# Patient Record
Sex: Female | Born: 1950
Health system: Southern US, Community
[De-identification: ages and names within clinical notes are randomized; demographics above are authoritative.]

## PROBLEM LIST (undated history)

## (undated) DIAGNOSIS — T7840XA Allergy, unspecified, initial encounter: Secondary | ICD-10-CM

## (undated) DIAGNOSIS — H269 Unspecified cataract: Secondary | ICD-10-CM

## (undated) DIAGNOSIS — H409 Unspecified glaucoma: Secondary | ICD-10-CM

## (undated) DIAGNOSIS — Z789 Other specified health status: Secondary | ICD-10-CM

## (undated) DIAGNOSIS — M8580 Other specified disorders of bone density and structure, unspecified site: Secondary | ICD-10-CM

## (undated) HISTORY — DX: Unspecified glaucoma: H40.9

## (undated) HISTORY — DX: Allergy, unspecified, initial encounter: T78.40XA

## (undated) HISTORY — DX: Other specified disorders of bone density and structure, unspecified site: M85.80

## (undated) HISTORY — DX: Unspecified cataract: H26.9

## (undated) HISTORY — DX: Other specified health status: Z78.9

## (undated) HISTORY — PX: COLONOSCOPY: SHX174

## (undated) HISTORY — PX: POLYPECTOMY: SHX149

## (undated) HISTORY — PX: BREAST CYST EXCISION: SHX579

---

## 1996-02-29 HISTORY — PX: TUBAL LIGATION: SHX77

## 2004-02-16 ENCOUNTER — Encounter: Admission: RE | Admit: 2004-02-16 | Discharge: 2004-02-16 | Payer: Self-pay | Admitting: Obstetrics and Gynecology

## 2004-02-29 HISTORY — PX: ABDOMINAL HYSTERECTOMY: SHX81

## 2004-05-05 ENCOUNTER — Observation Stay (HOSPITAL_COMMUNITY): Admission: RE | Admit: 2004-05-05 | Discharge: 2004-05-06 | Payer: Self-pay | Admitting: *Deleted

## 2013-04-20 ENCOUNTER — Encounter (HOSPITAL_COMMUNITY): Payer: Self-pay | Admitting: Emergency Medicine

## 2013-04-20 ENCOUNTER — Emergency Department (HOSPITAL_COMMUNITY)
Admission: EM | Admit: 2013-04-20 | Discharge: 2013-04-20 | Disposition: A | Discharge status: Home / Self Care | Payer: BC Managed Care – PPO | Attending: Emergency Medicine | Admitting: Emergency Medicine

## 2013-04-20 ENCOUNTER — Emergency Department (HOSPITAL_COMMUNITY): Payer: BC Managed Care – PPO

## 2013-04-20 DIAGNOSIS — M7989 Other specified soft tissue disorders: Secondary | ICD-10-CM

## 2013-04-20 DIAGNOSIS — M660 Rupture of popliteal cyst: Secondary | ICD-10-CM

## 2013-04-20 DIAGNOSIS — M79609 Pain in unspecified limb: Secondary | ICD-10-CM

## 2013-04-20 DIAGNOSIS — M7120 Synovial cyst of popliteal space [Baker], unspecified knee: Secondary | ICD-10-CM | POA: Insufficient documentation

## 2013-04-20 MED ORDER — IBUPROFEN 600 MG PO TABS: 600.0000 mg | ORAL_TABLET | Freq: Four times a day (QID) | ORAL | Status: DC | PRN

## 2013-04-20 MED ORDER — IBUPROFEN 800 MG PO TABS
800.0000 mg | ORAL_TABLET | Freq: Once | ORAL | Status: AC
  Administered 2013-04-20: 800 mg via ORAL
  Filled 2013-04-20: qty 1

## 2013-04-20 NOTE — ED Notes (Signed)
Per pt, states left knee/leg pain for about a week-pain worse, went to urgent care and was told to come here for possible clot

## 2013-04-20 NOTE — Discharge Instructions (Signed)
Elevate your leg. Try ice and heat for comfort. Take the ibuprofen for pain. Wear the knee sleeve for comfort. You can be rechecked by Dr Ronnie Derby, the orthopedist on call if you continue to have pain.

## 2013-04-20 NOTE — ED Provider Notes (Signed)
CSN: [id number]     Arrival date & time 04/20/13  1052 History   First MD Initiated Contact with Patient 04/20/13 1118     Chief Complaint  Patient presents with  . left leg pain      (Consider location/radiation/quality/duration/timing/severity/associated sxs/prior Treatment) HPI Pt reports she has had pain in her left calf radiating into her left posterior thigh for the past week. She reports the pain is getting worse. She denies any change in activity to precipitate the pain, no injury, no prolonged sitting or travel. She states she's never had this before. She denies chest pain, shortness of breath, fever, or having this discomfort in the past. . She denies any known family history for DVT, PE, or thrombophlebitis. She was seen today in urgent care and sent to the ED for further evaluation.   PCP none  History reviewed. No pertinent past medical history. Past Surgical History  Procedure Laterality Date  . Abdominal hysterectomy     No family history on file. History  Substance Use Topics  . Smoking status: Never Smoker   . Smokeless tobacco: Not on file  . Alcohol Use: No   retired  OB History   Grav Para Term Preterm Abortions TAB SAB Ect Mult Living                 Review of Systems  All other systems reviewed and are negative.      Allergies  Review of patient's allergies indicates no known allergies.  Home Medications   Current Outpatient Rx  Name  Route  Sig  Dispense  Refill  . ibuprofen (ADVIL,MOTRIN) 200 MG tablet   Oral   Take 200 mg by mouth every 6 (six) hours as needed for mild pain.         Marland Kitchen ibuprofen (ADVIL,MOTRIN) 600 MG tablet   Oral   Take 1 tablet (600 mg total) by mouth every 6 (six) hours as needed.   60 tablet   0    BP 130/69  Pulse 63  Temp(Src) 98.1 F (36.7 C) (Oral)  Resp 18  SpO2 97%  Vital signs normal   Physical Exam  Nursing note and vitals reviewed. Constitutional: She is oriented to person, place, and time.  She appears well-developed and well-nourished.  Non-toxic appearance. She does not appear ill. No distress.  HENT:  Head: Normocephalic and atraumatic.  Right Ear: External ear normal.  Left Ear: External ear normal.  Nose: Nose normal. No mucosal edema or rhinorrhea.  Mouth/Throat: Mucous membranes are normal. No dental abscesses or uvula swelling.  Eyes: Conjunctivae and EOM are normal. Pupils are equal, round, and reactive to light.  Neck: Normal range of motion and full passive range of motion without pain. Neck supple.  Pulmonary/Chest: Effort normal. No respiratory distress. She has no rhonchi. She exhibits no crepitus.  Abdominal: Normal appearance.  Musculoskeletal: Normal range of motion. She exhibits tenderness. She exhibits no edema.       Legs: Moves all extremities well.  Patient has no joint effusion on the right knee. Her right knee is nontender. Her right calf is nontender as is her right medial thigh. On the left she has a possible small effusion of her left knee. She's tender in her proximal calf on the left and into the popliteal area and the distal left thigh. She has mild fullness in the popliteal space, questionable Baker's cyst. She is nontender to palpation along the medial left thigh. Her skin is normal without  erythema or abrasions or lesions. Her anterior left knee is nontender.  Neurological: She is alert and oriented to person, place, and time. She has normal strength. No cranial nerve deficit.  Skin: Skin is warm, dry and intact. No rash noted. No erythema. No pallor.  Psychiatric: She has a normal mood and affect. Her speech is normal and behavior is normal. Her mood appears not anxious.    ED Course  Procedures (including critical care time)  12:38 Vascular Lab tech states her doppler US shows a ruptured Bakers cyst.   Pt given her test results. She was placed in a knee sleeve for comfort.   Labs Review Labs Reviewed - No data to display Imaging  Review Dg Knee Complete 4 Views Left  04/20/2013   CLINICAL DATA:  Knee pain.  No known injury.  EXAM: LEFT KNEE - COMPLETE 4+ VIEW  COMPARISON:  None.  FINDINGS: There is no evidence of fracture, dislocation, or joint effusion.  Mild degenerative spurring is seen involving the medial compartment and tibial spines, however there is no evidence of joint space narrowing. No other significant bone abnormality identified. Soft tissues are unremarkable.  IMPRESSION: No acute findings.  Early degenerative spurring of the medial compartment and tibial spines.   Electronically Signed   By: Earle Gell M.D.   On: 04/20/2013 12:31    EKG Interpretation   None       MDM   Final diagnoses:  Baker's cyst, ruptured    New Prescriptions   IBUPROFEN (ADVIL,MOTRIN) 600 MG TABLET    Take 1 tablet (600 mg total) by mouth every 6 (six) hours as needed.    Plan discharge   Rolland Porter, MD, Alanson Aly, MD 04/20/13 [phone]

## 2013-04-20 NOTE — Progress Notes (Addendum)
VASCULAR LAB PRELIMINARY  PRELIMINARY  PRELIMINARY  PRELIMINARY  Left lower extremity venous duplex completed.    Preliminary report:  Left:  No evidence of DVT or superficial thrombosis. There is a fluid filled space coursing approximately 7 cm from the popliteal fossa to the proximal calf consistent with a ruptured Baker's cyst.  Theresa Villarreal, RVS 04/20/2013, 12:35 PM

## 2013-08-09 DIAGNOSIS — M25562 Pain in left knee: Secondary | ICD-10-CM | POA: Insufficient documentation

## 2013-08-09 DIAGNOSIS — M1712 Unilateral primary osteoarthritis, left knee: Secondary | ICD-10-CM | POA: Insufficient documentation

## 2013-08-22 ENCOUNTER — Other Ambulatory Visit: Payer: Self-pay | Admitting: Orthopedic Surgery

## 2013-08-22 DIAGNOSIS — R609 Edema, unspecified: Secondary | ICD-10-CM

## 2013-08-22 DIAGNOSIS — M25562 Pain in left knee: Secondary | ICD-10-CM

## 2013-08-27 ENCOUNTER — Ambulatory Visit
Admission: RE | Admit: 2013-08-27 | Discharge: 2013-08-27 | Disposition: A | Discharge status: Home / Self Care | Payer: BC Managed Care – PPO | Source: Ambulatory Visit | Attending: Orthopedic Surgery | Admitting: Orthopedic Surgery

## 2013-08-27 DIAGNOSIS — R609 Edema, unspecified: Secondary | ICD-10-CM

## 2013-08-27 DIAGNOSIS — M25562 Pain in left knee: Secondary | ICD-10-CM

## 2014-05-23 ENCOUNTER — Encounter: Payer: Self-pay | Admitting: Family Medicine

## 2014-05-23 ENCOUNTER — Ambulatory Visit (INDEPENDENT_AMBULATORY_CARE_PROVIDER_SITE_OTHER): Payer: 59 | Admitting: Family Medicine

## 2014-05-23 VITALS — BP 131/77 | HR 69 | Temp 97.9°F | Resp 16 | Ht 68.5 in | Wt 240.0 lb

## 2014-05-23 DIAGNOSIS — E559 Vitamin D deficiency, unspecified: Secondary | ICD-10-CM

## 2014-05-23 DIAGNOSIS — Z23 Encounter for immunization: Secondary | ICD-10-CM

## 2014-05-23 DIAGNOSIS — R5383 Other fatigue: Secondary | ICD-10-CM

## 2014-05-23 LAB — COMPLETE METABOLIC PANEL WITH GFR
ALBUMIN: 4.1 g/dL (ref 3.5–5.2)
ALT: 13 U/L (ref 0–35)
AST: 16 U/L (ref 0–37)
Alkaline Phosphatase: 70 U/L (ref 39–117)
BUN: 14 mg/dL (ref 6–23)
CHLORIDE: 105 meq/L (ref 96–112)
CO2: 24 meq/L (ref 19–32)
Calcium: 9.4 mg/dL (ref 8.4–10.5)
Creat: 0.98 mg/dL (ref 0.50–1.10)
GFR, EST AFRICAN AMERICAN: 71 mL/min
GFR, EST NON AFRICAN AMERICAN: 62 mL/min
GLUCOSE: 75 mg/dL (ref 70–99)
POTASSIUM: 4.2 meq/L (ref 3.5–5.3)
SODIUM: 142 meq/L (ref 135–145)
TOTAL PROTEIN: 7.4 g/dL (ref 6.0–8.3)
Total Bilirubin: 0.5 mg/dL (ref 0.2–1.2)

## 2014-05-23 LAB — CBC WITH DIFFERENTIAL/PLATELET
BASOS ABS: 0 10*3/uL (ref 0.0–0.1)
Basophils Relative: 0 % (ref 0–1)
EOS PCT: 1 % (ref 0–5)
Eosinophils Absolute: 0.1 10*3/uL (ref 0.0–0.7)
HCT: 38.4 % (ref 36.0–46.0)
Hemoglobin: 12.6 g/dL (ref 12.0–15.0)
LYMPHS ABS: 2.4 10*3/uL (ref 0.7–4.0)
LYMPHS PCT: 39 % (ref 12–46)
MCH: 28.3 pg (ref 26.0–34.0)
MCHC: 32.8 g/dL (ref 30.0–36.0)
MCV: 86.3 fL (ref 78.0–100.0)
MONO ABS: 0.5 10*3/uL (ref 0.1–1.0)
MPV: 10.2 fL (ref 8.6–12.4)
Monocytes Relative: 8 % (ref 3–12)
Neutro Abs: 3.2 10*3/uL (ref 1.7–7.7)
Neutrophils Relative %: 52 % (ref 43–77)
PLATELETS: 268 10*3/uL (ref 150–400)
RBC: 4.45 MIL/uL (ref 3.87–5.11)
RDW: 14.2 % (ref 11.5–15.5)
WBC: 6.2 10*3/uL (ref 4.0–10.5)

## 2014-05-23 LAB — VITAMIN B12: Vitamin B-12: 753 pg/mL (ref 211–911)

## 2014-05-23 LAB — TSH: TSH: 1.512 u[IU]/mL (ref 0.350–4.500)

## 2014-05-23 NOTE — Progress Notes (Signed)
Subjective:    Patient ID: Theresa Villarreal, female    DOB: 06/05/1950, 64 y.o.   MRN: [id number] This chart was scribed for [name], MD by Zola Button, Medical Scribe. This patient was seen in Room 25 and the patient's care was started at 4:30 PM.   Chief Complaint  Patient presents with  . establish care    HPI HPI Comments: Theresa Villarreal is a 64 y.o. female who presents to the [facility] to establish care.  Her husband is a pt of mine and Charda always accompanies him to every visit so know well.  Health Maintenance: Patient has not had a breast exam recently. She had a hysterectomy in 2006 done in [place]. She denies chest pain, SOB, leg swelling, vaginal discharge and urinary symptoms.  Immunizations: Patient is not UTD on tetanus.  Sleep: Patient gets about 6-7 hours of sleep per night, going to bed around 9:00 PM. She will usually have nocturia once a night.  Vitamins/Supplements: She does not take any vitamins. She does not drink milk regularly.  Psychiatric: Patient states her mood has been good. She does get moody every once in a while.  Her husband has been trying to get a Hoveround wheelchair. He is on pain medications, taking 2 every 12 hours.  No past medical history on file. No current outpatient prescriptions on file prior to visit.   No current facility-administered medications on file prior to visit.   No Known Allergies   Review of Systems  Constitutional: Positive for fatigue. Negative for fever, chills, activity change, appetite change and unexpected weight change.  Respiratory: Negative for shortness of breath.   Cardiovascular: Negative for chest pain and leg swelling.  Gastrointestinal: Negative for vomiting.  Endocrine: Positive for polyuria.  Genitourinary: Negative.  Negative for vaginal discharge and enuresis.  Neurological: Negative for syncope and weakness.  Psychiatric/Behavioral: Negative for sleep disturbance,  dysphoric mood and agitation. The patient is not nervous/anxious.        Objective:  BP 131/77 mmHg  Pulse 69  Temp(Src) 97.9 F (36.6 C) (Oral)  Resp 16  Ht 5' 8.5" (1.74 m)  Wt 240 lb (108.863 kg)  BMI 35.96 kg/m2  SpO2 100%  Physical Exam  Constitutional: She is oriented to person, place, and time. She appears well-developed and well-nourished. No distress.  HENT:  Head: Normocephalic and atraumatic.  Mouth/Throat: Oropharynx is clear and moist. No oropharyngeal exudate.  Eyes: Pupils are equal, round, and reactive to light.  Neck: Neck supple. No thyromegaly present.  Thyroid normal.  Cardiovascular: Normal rate, regular rhythm, S1 normal, S2 normal and normal heart sounds.   No murmur heard. Pulmonary/Chest: Effort normal and breath sounds normal. No respiratory distress. She has no wheezes. She has no rales.  CTAB.  Musculoskeletal: She exhibits no edema.  Neurological: She is alert and oriented to person, place, and time. No cranial nerve deficit.  Skin: Skin is warm and dry. No rash noted.  Psychiatric: She has a normal mood and affect. Her behavior is normal.  Vitals reviewed.         Assessment & Plan:  Other fatigue - Plan: Vitamin B12, CBC with Differential/Platelet, COMPLETE METABOLIC PANEL WITH GFR, TSH, Vit D  25 hydroxy (rtn osteoporosis monitoring)  Need for prophylactic vaccination with combined diphtheria-tetanus-pertussis (DTP) vaccine - Plan: Tdap vaccine greater than or equal to 7yo IM   I personally performed the services described in this documentation, which was scribed in my  presence. The recorded information has been reviewed and considered, and addended by me as needed.  [name], MD MPH   Results for orders placed or performed in visit on 05/23/14  Vitamin B12  Result Value Ref Range   Vitamin B-12 753 211 - 911 pg/mL  CBC with Differential/Platelet  Result Value Ref Range   WBC 6.2 4.0 - 10.5 K/uL   RBC 4.45 3.87 - 5.11 MIL/uL    Hemoglobin 12.6 12.0 - 15.0 g/dL   HCT 38.4 36.0 - 46.0 %   MCV 86.3 78.0 - 100.0 fL   MCH 28.3 26.0 - 34.0 pg   MCHC 32.8 30.0 - 36.0 g/dL   RDW 14.2 11.5 - 15.5 %   Platelets 268 150 - 400 K/uL   MPV 10.2 8.6 - 12.4 fL   Neutrophils Relative % 52 43 - 77 %   Neutro Abs 3.2 1.7 - 7.7 K/uL   Lymphocytes Relative 39 12 - 46 %   Lymphs Abs 2.4 0.7 - 4.0 K/uL   Monocytes Relative 8 3 - 12 %   Monocytes Absolute 0.5 0.1 - 1.0 K/uL   Eosinophils Relative 1 0 - 5 %   Eosinophils Absolute 0.1 0.0 - 0.7 K/uL   Basophils Relative 0 0 - 1 %   Basophils Absolute 0.0 0.0 - 0.1 K/uL   Smear Review Criteria for review not met   COMPLETE METABOLIC PANEL WITH GFR  Result Value Ref Range   Sodium 142 135 - 145 mEq/L   Potassium 4.2 3.5 - 5.3 mEq/L   Chloride 105 96 - 112 mEq/L   CO2 24 19 - 32 mEq/L   Glucose, Bld 75 70 - 99 mg/dL   BUN 14 6 - 23 mg/dL   Creat 0.98 0.50 - 1.10 mg/dL   Total Bilirubin 0.5 0.2 - 1.2 mg/dL   Alkaline Phosphatase 70 39 - 117 U/L   AST 16 0 - 37 U/L   ALT 13 0 - 35 U/L   Total Protein 7.4 6.0 - 8.3 g/dL   Albumin 4.1 3.5 - 5.2 g/dL   Calcium 9.4 8.4 - 10.5 mg/dL   GFR, Est African American 71 mL/min   GFR, Est Non African American 62 mL/min  TSH  Result Value Ref Range   TSH 1.512 0.350 - 4.500 uIU/mL  Vit D  25 hydroxy (rtn osteoporosis monitoring)  Result Value Ref Range   Vit D, 25-Hydroxy 11 (L) 30 - 100 ng/mL

## 2014-05-24 LAB — VITAMIN D 25 HYDROXY (VIT D DEFICIENCY, FRACTURES): Vit D, 25-Hydroxy: 11 ng/mL — ABNORMAL LOW (ref 30–100)

## 2014-06-23 ENCOUNTER — Encounter: Payer: Self-pay | Admitting: Family Medicine

## 2014-06-23 DIAGNOSIS — E559 Vitamin D deficiency, unspecified: Secondary | ICD-10-CM | POA: Insufficient documentation

## 2014-06-23 DIAGNOSIS — R5383 Other fatigue: Secondary | ICD-10-CM | POA: Insufficient documentation

## 2014-06-23 MED ORDER — ERGOCALCIFEROL 1.25 MG (50000 UT) PO CAPS: 50000.0000 [IU] | ORAL_CAPSULE | ORAL | Status: DC

## 2014-06-23 NOTE — Addendum Note (Signed)
Addended by: [name] on: 06/23/2014 12:37 AM   Modules accepted: Orders

## 2014-07-31 ENCOUNTER — Encounter: Payer: Self-pay | Admitting: *Deleted

## 2014-12-01 ENCOUNTER — Telehealth: Payer: Self-pay | Admitting: Family Medicine

## 2014-12-01 DIAGNOSIS — Z1231 Encounter for screening mammogram for malignant neoplasm of breast: Secondary | ICD-10-CM

## 2014-12-01 NOTE — Telephone Encounter (Signed)
Spoke with patient about her overdue mammogram.   She is going to call [facility] and set it up at her earliest convenience.  She will have them fax [place] a copy of the report.

## 2014-12-11 LAB — HM MAMMOGRAPHY

## 2014-12-19 ENCOUNTER — Encounter: Payer: Self-pay | Admitting: Family Medicine

## 2014-12-19 LAB — HM MAMMOGRAPHY

## 2014-12-21 ENCOUNTER — Other Ambulatory Visit: Payer: Self-pay | Admitting: Family Medicine

## 2014-12-21 MED ORDER — ZOSTER VACCINE LIVE 19400 UNT/0.65ML ~~LOC~~ SOLR: 0.6500 mL | Freq: Once | SUBCUTANEOUS | Status: DC

## 2014-12-23 ENCOUNTER — Encounter: Payer: Self-pay | Admitting: Family Medicine

## 2016-01-22 ENCOUNTER — Ambulatory Visit (INDEPENDENT_AMBULATORY_CARE_PROVIDER_SITE_OTHER): Payer: Commercial Managed Care - HMO | Admitting: Family Medicine

## 2016-01-22 VITALS — BP 136/76 | HR 70 | Temp 98.2°F | Resp 16 | Ht 68.5 in | Wt 239.2 lb

## 2016-01-22 DIAGNOSIS — R1030 Lower abdominal pain, unspecified: Secondary | ICD-10-CM | POA: Diagnosis not present

## 2016-01-22 DIAGNOSIS — M545 Low back pain, unspecified: Secondary | ICD-10-CM

## 2016-01-22 LAB — POCT URINALYSIS DIP (MANUAL ENTRY)
Bilirubin, UA: NEGATIVE
Glucose, UA: NEGATIVE
Ketones, POC UA: NEGATIVE
Leukocytes, UA: NEGATIVE
Nitrite, UA: NEGATIVE
Protein Ur, POC: NEGATIVE
Spec Grav, UA: 1.02
Urobilinogen, UA: 0.2
pH, UA: 5.5

## 2016-01-22 LAB — POC MICROSCOPIC URINALYSIS (UMFC): Mucus: ABSENT

## 2016-01-22 MED ORDER — CYCLOBENZAPRINE HCL 10 MG PO TABS: 10.0000 mg | ORAL_TABLET | Freq: Three times a day (TID) | ORAL | 0 refills | Status: DC | PRN

## 2016-01-22 MED ORDER — TRAMADOL HCL 50 MG PO TABS: 50.0000 mg | ORAL_TABLET | Freq: Three times a day (TID) | ORAL | 1 refills | Status: DC | PRN

## 2016-01-22 MED ORDER — DICLOFENAC SODIUM 75 MG PO TBEC: 75.0000 mg | DELAYED_RELEASE_TABLET | Freq: Two times a day (BID) | ORAL | 0 refills | Status: DC

## 2016-01-22 NOTE — Progress Notes (Addendum)
By signing my name below, I, [name], attest that this documentation has been prepared under the direction and in the presence of [name], MD.  Electronically Signed: [name], Medical Scribe. 01/22/16. 8:23 AM.  Subjective:    Patient ID: Theresa Villarreal, female    DOB: 03/02/1950, 65 y.o.   MRN: [id number]  HPI Chief Complaint  Patient presents with  . Back Pain    x 1 week, took Ibuprofen, but nothing in the last 24 hours    HPI Comments: Theresa Villarreal is a 65 y.o. female who presents to the [facility] complaining of intermittent back pain onset 1 week. She noted the pain radiates to her lower abdomen, last for a couple of minutes and worsens with activities such as bending and lifting so she has to be careful. Pt mentions her lateral left thigh occasionally becomes numb. She's tried ibuprofen without relief of her symptoms and hasn't taken any today. Denies pain radiating down her legs, trouble urinating, change in eating/drinking, sleep disturbance, weakness, and rashes.   Patient Active Problem List   Diagnosis Date Noted  . Vitamin D deficiency 06/23/2014  . Caregiver with fatigue 06/23/2014   History reviewed. No pertinent past medical history. Past Surgical History:  Procedure Laterality Date  . ABDOMINAL HYSTERECTOMY     No Known Allergies Prior to Admission medications   Medication Sig Start Date End Date Taking? Authorizing Provider  ergocalciferol (VITAMIN D2) 50000 UNITS capsule Take 1 capsule (50,000 Units total) by mouth once a week. 06/23/14  Yes Shawnee Knapp, MD  zoster vaccine live, PF, (ZOSTAVAX) 60454 UNT/0.65ML injection Inject 19,400 Units into the skin once. Patient not taking: Reported on 01/22/2016 12/21/14   Shawnee Knapp, MD   Social History   Social History  . Marital status: Married    Spouse name: N/A  . Number of children: N/A  . Years of education: N/A   Occupational History  . Not on file.   Social History  Main Topics  . Smoking status: Never Smoker  . Smokeless tobacco: Never Used  . Alcohol use No  . Drug use: No  . Sexual activity: Not on file   Other Topics Concern  . Not on file   Social History Narrative  . No narrative on file    Review of Systems  Constitutional: Positive for activity change. Negative for appetite change.  Gastrointestinal: Positive for abdominal pain.  Genitourinary: Negative for difficulty urinating.  Musculoskeletal: Positive for back pain and myalgias.  Skin: Negative for rash.  Neurological: Positive for numbness. Negative for weakness.  Psychiatric/Behavioral: Negative for sleep disturbance.   Objective:  Physical Exam  Constitutional: She appears well-developed and well-nourished. No distress.  HENT:  Head: Normocephalic and atraumatic.  Eyes: Conjunctivae are normal.  Neck: Neck supple.  Cardiovascular: Normal rate and regular rhythm.  Exam reveals no gallop and no friction rub.   No murmur heard. Pulmonary/Chest: Effort normal and breath sounds normal. No respiratory distress. She has no wheezes. She has no rales.  Abdominal: There is tenderness in the suprapubic area. There is no rebound, no guarding and no CVA tenderness.  Musculoskeletal:  Tenderness over the lumbar sacral junction No paraspinal spasms palpable Minimal tenderness over the SI joints and none over the trochantera bursa  Neurological: She is alert.  Reflex Scores:      Patellar reflexes are 2+ on the right side and 2+ on the left side.  Achilles reflexes are 2+ on the right side and 2+ on the left side. Neg straight leg raise but severe dec ROM Strength 5/5  Skin: Skin is warm and dry.  Psychiatric: She has a normal mood and affect. Her behavior is normal.  Nursing note and vitals reviewed.  BP 136/76 (BP Location: Right Arm, Patient Position: Sitting, Cuff Size: Small)   Pulse 70   Temp 98.2 F (36.8 C) (Oral)   Resp 16   Ht 5' 8.5" (1.74 m)   Wt 239 lb 3.2 oz  (108.5 kg)   SpO2 97%   BMI 35.84 kg/m    Results for orders placed or performed in visit on 01/22/16  POCT urinalysis dipstick  Result Value Ref Range   Color, UA yellow yellow   Clarity, UA clear clear   Glucose, UA negative negative   Bilirubin, UA negative negative   Ketones, POC UA negative negative   Spec Grav, UA 1.020    Blood, UA trace-intact (A) negative   pH, UA 5.5    Protein Ur, POC negative negative   Urobilinogen, UA 0.2    Nitrite, UA Negative Negative   Leukocytes, UA Negative Negative  POCT Microscopic Urinalysis ([facility])  Result Value Ref Range   WBC,UR,HPF,POC None None WBC/hpf   RBC,UR,HPF,POC None None RBC/hpf   Bacteria Few (A) None, Too numerous to count   Mucus Absent Absent   Epithelial Cells, UR Per Microscopy None None, Too numerous to count cells/hpf    Assessment & Plan:   1. Acute bilateral low back pain without sciatica   2. Lower abdominal pain   If sxs do not improve within 1 wk, RTC for further eval and xray and pelvic. Start fiber supp or stool softner  Orders Placed This Encounter  Procedures  . POCT urinalysis dipstick  . POCT Microscopic Urinalysis ([facility])    Meds ordered this encounter  Medications  . diclofenac (VOLTAREN) 75 MG EC tablet    Sig: Take 1 tablet (75 mg total) by mouth 2 (two) times daily.    Dispense:  30 tablet    Refill:  0  . cyclobenzaprine (FLEXERIL) 10 MG tablet    Sig: Take 1 tablet (10 mg total) by mouth 3 (three) times daily as needed for muscle spasms.    Dispense:  30 tablet    Refill:  0  . traMADol (ULTRAM) 50 MG tablet    Sig: Take 1 tablet (50 mg total) by mouth every 8 (eight) hours as needed.    Dispense:  30 tablet    Refill:  1    I personally performed the services described in this documentation, which was scribed in my presence. The recorded information has been reviewed and considered, and addended by me as needed.   [name], M.D.  [facility] [address] [place], [place] [address] [phone] phone [phone] fax  01/22/16 9:51 PM

## 2016-01-22 NOTE — Patient Instructions (Addendum)
Start daily fiber supplement like benefiber/metamucil or a stool softener like colace to make sure that small amounts of retained stool are not causing abdominal your symptoms.   IF you received an x-ray today, you will receive an invoice from [facility]. Please contact [facility] at [phone] with questions or concerns regarding your invoice.   IF you received labwork today, you will receive an invoice from [employer]. Please contact [facility] at [phone] with questions or concerns regarding your invoice.   Our billing staff will not be able to assist you with questions regarding bills from these companies.  You will be contacted with the lab results as soon as they are available. The fastest way to get your results is to activate your My Chart account. Instructions are located on the last page of this paperwork. If you have not heard from [place] regarding the results in 2 weeks, please contact this office.     Back Pain, Adult Introduction Back pain is very common. The pain often gets better over time. The cause of back pain is usually not dangerous. Most people can learn to manage their back pain on their own. Follow these instructions at home: Watch your back pain for any changes. The following actions may help to lessen any pain you are feeling:  Stay active. Start with short walks on flat ground if you can. Try to walk farther each day.  Exercise regularly as told by your doctor. Exercise helps your back heal faster. It also helps avoid future injury by keeping your muscles strong and flexible.  Do not sit, drive, or stand in one place for more than 30 minutes.  Do not stay in bed. Resting more than 1-2 days can slow down your recovery.  Be careful when you bend or lift an object. Use good form when lifting:  Bend at your knees.  Keep the object close to your body.  Do not twist.  Sleep on a firm mattress. Lie on your side,  and bend your knees. If you lie on your back, put a pillow under your knees.  Take medicines only as told by your doctor.  Put ice on the injured area.  Put ice in a plastic bag.  Place a towel between your skin and the bag.  Leave the ice on for 20 minutes, 2-3 times a day for the first 2-3 days. After that, you can switch between ice and heat packs.  Avoid feeling anxious or stressed. Find good ways to deal with stress, such as exercise.  Maintain a healthy weight. Extra weight puts stress on your back. Contact a doctor if:  You have pain that does not go away with rest or medicine.  You have worsening pain that goes down into your legs or buttocks.  You have pain that does not get better in one week.  You have pain at night.  You lose weight.  You have a fever or chills. Get help right away if:  You cannot control when you poop (bowel movement) or pee (urinate).  Your arms or legs feel weak.  Your arms or legs lose feeling (numbness).  You feel sick to your stomach (nauseous) or throw up (vomit).  You have belly (abdominal) pain.  You feel like you may pass out (faint). This information is not intended to replace advice given to you by your health care provider. Make sure you discuss any questions you have with your health care provider. Document Released: 08/03/2007  Document Revised: 07/23/2015 Document Reviewed: 06/18/2013  2017 [employer]

## 2016-02-09 LAB — HM MAMMOGRAPHY

## 2016-05-01 DIAGNOSIS — E559 Vitamin D deficiency, unspecified: Secondary | ICD-10-CM | POA: Diagnosis not present

## 2016-05-01 DIAGNOSIS — Z90710 Acquired absence of both cervix and uterus: Secondary | ICD-10-CM | POA: Diagnosis not present

## 2016-05-01 DIAGNOSIS — Z6834 Body mass index (BMI) 34.0-34.9, adult: Secondary | ICD-10-CM | POA: Diagnosis not present

## 2016-07-18 ENCOUNTER — Telehealth: Payer: Self-pay | Admitting: Family Medicine

## 2016-07-18 MED ORDER — ERGOCALCIFEROL 1.25 MG (50000 UT) PO CAPS: 50000.0000 [IU] | ORAL_CAPSULE | ORAL | 1 refills | Status: DC

## 2016-07-18 NOTE — Telephone Encounter (Signed)
Pt requested refill of D2

## 2016-08-27 ENCOUNTER — Encounter: Payer: Commercial Managed Care - HMO | Admitting: Family Medicine

## 2016-09-03 ENCOUNTER — Encounter: Payer: Self-pay | Admitting: Family Medicine

## 2016-09-03 ENCOUNTER — Ambulatory Visit (INDEPENDENT_AMBULATORY_CARE_PROVIDER_SITE_OTHER): Payer: Commercial Managed Care - HMO | Admitting: Family Medicine

## 2016-09-03 VITALS — BP 130/68 | HR 76 | Temp 99.0°F | Resp 16 | Ht 68.5 in | Wt 236.4 lb

## 2016-09-03 DIAGNOSIS — R5383 Other fatigue: Secondary | ICD-10-CM | POA: Diagnosis not present

## 2016-09-03 DIAGNOSIS — Z1389 Encounter for screening for other disorder: Secondary | ICD-10-CM

## 2016-09-03 DIAGNOSIS — Z113 Encounter for screening for infections with a predominantly sexual mode of transmission: Secondary | ICD-10-CM | POA: Diagnosis not present

## 2016-09-03 DIAGNOSIS — Z1383 Encounter for screening for respiratory disorder NEC: Secondary | ICD-10-CM | POA: Diagnosis not present

## 2016-09-03 DIAGNOSIS — E559 Vitamin D deficiency, unspecified: Secondary | ICD-10-CM

## 2016-09-03 DIAGNOSIS — Z0000 Encounter for general adult medical examination without abnormal findings: Secondary | ICD-10-CM | POA: Diagnosis not present

## 2016-09-03 DIAGNOSIS — R3121 Asymptomatic microscopic hematuria: Secondary | ICD-10-CM

## 2016-09-03 DIAGNOSIS — Z136 Encounter for screening for cardiovascular disorders: Secondary | ICD-10-CM

## 2016-09-03 DIAGNOSIS — Z1212 Encounter for screening for malignant neoplasm of rectum: Secondary | ICD-10-CM

## 2016-09-03 DIAGNOSIS — Z1231 Encounter for screening mammogram for malignant neoplasm of breast: Secondary | ICD-10-CM | POA: Diagnosis not present

## 2016-09-03 DIAGNOSIS — Z1211 Encounter for screening for malignant neoplasm of colon: Secondary | ICD-10-CM | POA: Diagnosis not present

## 2016-09-03 DIAGNOSIS — Z780 Asymptomatic menopausal state: Secondary | ICD-10-CM | POA: Diagnosis not present

## 2016-09-03 DIAGNOSIS — N760 Acute vaginitis: Secondary | ICD-10-CM

## 2016-09-03 DIAGNOSIS — B9689 Other specified bacterial agents as the cause of diseases classified elsewhere: Secondary | ICD-10-CM

## 2016-09-03 LAB — POCT URINALYSIS DIP (MANUAL ENTRY)
Bilirubin, UA: NEGATIVE
Blood, UA: NEGATIVE
Glucose, UA: NEGATIVE mg/dL
Ketones, POC UA: NEGATIVE mg/dL
LEUKOCYTES UA: NEGATIVE
Nitrite, UA: NEGATIVE
PROTEIN UA: NEGATIVE mg/dL
Spec Grav, UA: >=1.03 — AB (ref 1.010–1.025)
Urobilinogen, UA: 0.2 E.U./dL
pH, UA: 5.5 (ref 5.0–8.0)

## 2016-09-03 LAB — POCT WET + KOH PREP
Trich by wet prep: ABSENT
Yeast by KOH: ABSENT
Yeast by wet prep: ABSENT

## 2016-09-03 MED ORDER — ZOSTER VAC RECOMB ADJUVANTED 50 MCG/0.5ML IM SUSR: 0.5000 mL | Freq: Once | INTRAMUSCULAR | 1 refills | Status: AC

## 2016-09-03 MED ORDER — ERGOCALCIFEROL 1.25 MG (50000 UT) PO CAPS: 50000.0000 [IU] | ORAL_CAPSULE | ORAL | 1 refills | Status: DC

## 2016-09-03 MED ORDER — METRONIDAZOLE 500 MG PO TABS: 500.0000 mg | ORAL_TABLET | Freq: Two times a day (BID) | ORAL | 0 refills | Status: DC

## 2016-09-03 NOTE — Patient Instructions (Addendum)
Theresa Villarreal , Thank you for taking time to come for your Medicare Wellness Visit. I appreciate your ongoing commitment to your health goals. Please review the following plan we discussed and let me know if I can assist you in the future.   These are the goals we discussed: Goals    None      This is a list of the screening recommended for you and due dates:  Health Maintenance  Topic Date Due  .  Hepatitis C: One time screening is recommended by Center for Disease Control  (CDC) for  adults born from [birth year] through 1965.   [birth year]  . HIV Screening  10/30/1965  . Pap Smear  10/31/1971  . Colon Cancer Screening  10/30/2000  . DEXA scan (bone density measurement)  10/31/2015  . Flu Shot  09/28/2016  . Pneumonia vaccines (2 of 2 - PCV13) 01/07/2017  . Mammogram  02/08/2018  . Tetanus Vaccine  05/22/2024    IF you received an x-ray today, you will receive an invoice from [facility]. Please contact [facility] at [phone] with questions or concerns regarding your invoice.   IF you received labwork today, you will receive an invoice from [facility]. Please contact [facility] at [phone] with questions or concerns regarding your invoice.   Our billing staff will not be able to assist you with questions regarding bills from these companies.  You will be contacted with the lab results as soon as they are available. The fastest way to get your results is to activate your My Chart account. Instructions are located on the last page of this paperwork. If you have not heard from [place] regarding the results in 2 weeks, please contact this office.      Vitamin D Deficiency Vitamin D deficiency is when your body does not have enough vitamin D. Vitamin D is important to your body for many reasons:  It helps the body to absorb two important minerals, called calcium and phosphorus.  It plays a role in bone health.  It may help to prevent some diseases, such as diabetes and  multiple sclerosis.  It plays a role in muscle function, including heart function.  You can get vitamin D by:  Eating foods that naturally contain vitamin D.  Eating or drinking milk or other dairy products that have vitamin D added to them.  Taking a vitamin D supplement or a multivitamin supplement that contains vitamin D.  Being in the sun. Your body naturally makes vitamin D when your skin is exposed to sunlight. Your body changes the sunlight into a form of the vitamin that the body can use.  If vitamin D deficiency is severe, it can cause a condition in which your bones become soft. In adults, this condition is called osteomalacia. In children, this condition is called rickets. What are the causes? Vitamin D deficiency may be caused by:  Not eating enough foods that contain vitamin D.  Not getting enough sun exposure.  Having certain digestive system diseases that make it difficult for your body to absorb vitamin D. These diseases include Crohn disease, chronic pancreatitis, and cystic fibrosis.  Having a surgery in which a part of the stomach or a part of the small intestine is removed.  Being obese.  Having chronic kidney disease or liver disease.  What increases the risk? This condition is more likely to develop in:  Older people.  People who do not spend much time outdoors.  People who live  in a long-term care facility.  People who have had broken bones.  People with weak or thin bones (osteoporosis).  People who have a disease or condition that changes how the body absorbs vitamin D.  People who have dark skin.  People who take certain medicines, such as steroid medicines or certain seizure medicines.  People who are overweight or obese.  What are the signs or symptoms? In mild cases of vitamin D deficiency, there may not be any symptoms. If the condition is severe, symptoms may include:  Bone pain.  Muscle pain.  Falling often.  Broken bones  caused by a minor injury.  How is this diagnosed? This condition is usually diagnosed with a blood test. How is this treated? Treatment for this condition may depend on what caused the condition. Treatment options include:  Taking vitamin D supplements.  Taking a calcium supplement. Your health care provider will suggest what dose is best for you.  Follow these instructions at home:  Take medicines and supplements only as told by your health care provider.  Eat foods that contain vitamin D. Choices include: ? Fortified dairy products, cereals, or juices. Fortified means that vitamin D has been added to the food. Check the label on the package to be sure. ? Fatty fish, such as salmon or trout. ? Eggs. ? Oysters.  Do not use a tanning bed.  Maintain a healthy weight. Lose weight, if needed.  Keep all follow-up visits as told by your health care provider. This is important. Contact a health care provider if:  Your symptoms do not go away.  You feel like throwing up (nausea) or you throw up (vomit).  You have fewer bowel movements than usual or it is difficult for you to have a bowel movement (constipation). This information is not intended to replace advice given to you by your health care provider. Make sure you discuss any questions you have with your health care provider. Document Released: 05/09/2011 Document Revised: 07/29/2015 Document Reviewed: 07/02/2014 Elsevier Interactive Patient Education  2018 [employer].  Bacterial Vaginosis Bacterial vaginosis is a vaginal infection that occurs when the normal balance of bacteria in the vagina is disrupted. It results from an overgrowth of certain bacteria. This is the most common vaginal infection among women ages 29-44. Because bacterial vaginosis increases your risk for STIs (sexually transmitted infections), getting treated can help reduce your risk for chlamydia, gonorrhea, herpes, and HIV (human immunodeficiency virus).  Treatment is also important for preventing complications in pregnant women, because this condition can cause an early (premature) delivery. What are the causes? This condition is caused by an increase in harmful bacteria that are normally present in small amounts in the vagina. However, the reason that the condition develops is not fully understood. What increases the risk? The following factors may make you more likely to develop this condition:  Having a new sexual partner or multiple sexual partners.  Having unprotected sex.  Douching.  Having an intrauterine device (IUD).  Smoking.  Drug and alcohol abuse.  Taking certain antibiotic medicines.  Being pregnant.  You cannot get bacterial vaginosis from toilet seats, bedding, swimming pools, or contact with objects around you. What are the signs or symptoms? Symptoms of this condition include:  Grey or white vaginal discharge. The discharge can also be watery or foamy.  A fish-like odor with discharge, especially after sexual intercourse or during menstruation.  Itching in and around the vagina.  Burning or pain with urination.  Some women  with bacterial vaginosis have no signs or symptoms. How is this diagnosed? This condition is diagnosed based on:  Your medical history.  A physical exam of the vagina.  Testing a sample of vaginal fluid under a microscope to look for a large amount of bad bacteria or abnormal cells. Your health care provider may use a cotton swab or a small wooden spatula to collect the sample.  How is this treated? This condition is treated with antibiotics. These may be given as a pill, a vaginal cream, or a medicine that is put into the vagina (suppository). If the condition comes back after treatment, a second round of antibiotics may be needed. Follow these instructions at home: Medicines  Take over-the-counter and prescription medicines only as told by your health care provider.  Take or  use your antibiotic as told by your health care provider. Do not stop taking or using the antibiotic even if you start to feel better. General instructions  If you have a female sexual partner, tell her that you have a vaginal infection. She should see her health care provider and be treated if she has symptoms. If you have a female sexual partner, he does not need treatment.  During treatment: ? Avoid sexual activity until you finish treatment. ? Do not douche. ? Avoid alcohol as directed by your health care provider. ? Avoid breastfeeding as directed by your health care provider.  Drink enough water and fluids to keep your urine clear or pale yellow.  Keep the area around your vagina and rectum clean. ? Wash the area daily with warm water. ? Wipe yourself from front to back after using the toilet.  Keep all follow-up visits as told by your health care provider. This is important. How is this prevented?  Do not douche.  Wash the outside of your vagina with warm water only.  Use protection when having sex. This includes latex condoms and dental dams.  Limit how many sexual partners you have. To help prevent bacterial vaginosis, it is best to have sex with just one partner (monogamous).  Make sure you and your sexual partner are tested for STIs.  Wear cotton or cotton-lined underwear.  Avoid wearing tight pants and pantyhose, especially during summer.  Limit the amount of alcohol that you drink.  Do not use any products that contain nicotine or tobacco, such as cigarettes and e-cigarettes. If you need help quitting, ask your health care provider.  Do not use illegal drugs. Where to find more information:  Centers for Disease Control and Prevention: AppraiserFraud.fi  American Sexual Health Association (ASHA): www.ashastd.org  U.S. Department of Health and Financial controller, Office on Women's Health: DustingSprays.pl or  SecuritiesCard.it Contact a health care provider if:  Your symptoms do not improve, even after treatment.  You have more discharge or pain when urinating.  You have a fever.  You have pain in your abdomen.  You have pain during sex.  You have vaginal bleeding between periods. Summary  Bacterial vaginosis is a vaginal infection that occurs when the normal balance of bacteria in the vagina is disrupted.  Because bacterial vaginosis increases your risk for STIs (sexually transmitted infections), getting treated can help reduce your risk for chlamydia, gonorrhea, herpes, and HIV (human immunodeficiency virus). Treatment is also important for preventing complications in pregnant women, because the condition can cause an early (premature) delivery.  This condition is treated with antibiotic medicines. These may be given as a pill, a vaginal cream, or a medicine  that is put into the vagina (suppository). This information is not intended to replace advice given to you by your health care provider. Make sure you discuss any questions you have with your health care provider. Document Released: 02/14/2005 Document Revised: 10/31/2015 Document Reviewed: 10/31/2015 Elsevier Interactive Patient Education  2017 [employer].

## 2016-09-03 NOTE — Progress Notes (Addendum)
Subjective:    Theresa Villarreal is a 66 y.o. female who presents for a Welcome to Medicare exam.   Review of Systems Decreased concentration. All other ROS negative.         Objective:    BP 130/68 (BP Location: Right Arm, Patient Position: Sitting, Cuff Size: Large)   Pulse 76   Temp 99 F (37.2 C) (Oral)   Resp 16   Ht 5' 8.5" (1.74 m)   Wt 236 lb 6.4 oz (107.2 kg)   SpO2 97%   BMI 35.42 kg/m    Visual Acuity Screening   Right eye Left eye Both eyes  Without correction:     With correction: 20/25 20/30 20/20     Medications Outpatient Encounter Prescriptions as of 09/03/2016  Medication Sig  . cyclobenzaprine (FLEXERIL) 10 MG tablet Take 1 tablet (10 mg total) by mouth 3 (three) times daily as needed for muscle spasms.  . diclofenac (VOLTAREN) 75 MG EC tablet Take 1 tablet (75 mg total) by mouth 2 (two) times daily.  . ergocalciferol (VITAMIN D2) 50000 units capsule Take 1 capsule (50,000 Units total) by mouth once a week.  . traMADol (ULTRAM) 50 MG tablet Take 1 tablet (50 mg total) by mouth every 8 (eight) hours as needed.  . zoster vaccine live, PF, (ZOSTAVAX) 62836 UNT/0.65ML injection Inject 19,400 Units into the skin once. (Patient not taking: Reported on 01/22/2016)   No facility-administered encounter medications on file as of 09/03/2016.    Occ takes mvi from [facility]  History: No past medical history on file. Past Surgical History:  Procedure Laterality Date  . ABDOMINAL HYSTERECTOMY - secondary to fibroids and metrorrhagia  2006    No family history on file. Social History   Occupational History  . Not on file.   Social History Main Topics  . Smoking status: Never Smoker  . Smokeless tobacco: Never Used  . Alcohol use No  . Drug use: No  . Sexual activity: Not on file    Tobacco Counseling Counseling given: Not Answered   Immunizations and Health Maintenance Immunization History  Administered Date(s) Administered  . Influenza-Unspecified  01/08/2016  . Pneumococcal-Unspecified 01/08/2016  . Tdap 05/23/2014   Health Maintenance Due  Topic Date Due  . Hepatitis C Screening  03-14-1950  . HIV Screening  10/30/1965  . PAP SMEAR  10/31/1971  . COLONOSCOPY  10/30/2000  . DEXA SCAN  10/31/2015    Activities of Daily Living In your present state of health, do you have any difficulty performing the following activities: 09/03/2016  Hearing? N  Vision? Y  Difficulty concentrating or making decisions? Y  Walking or climbing stairs? N  Dressing or bathing? N  Doing errands, shopping? N  Some recent data might be hidden   Depression screen [facility] 2/9 09/03/2016 01/22/2016 05/23/2014  Decreased Interest 0 0 0  Down, Depressed, Hopeless 0 0 0  PHQ - 2 Score 0 0 0    Physical Exam see above note.  Advanced Directives: None. Is fine with normal HCPOA order.       Assessment:    This is a routine wellness examination for this patient .   1. Welcome to Medicare preventive visit   2. Routine screening for STI (sexually transmitted infection)   3. Encounter for screening mammogram for breast cancer   4. Screening for cardiovascular, respiratory, and genitourinary diseases   5. Screening for colorectal cancer - nonw  6. Vitamin D deficiency   7. Asymptomatic microscopic  hematuria   8. Fatigue, unspecified type      Vision/Hearing screen  Visual Acuity Screening   Right eye Left eye Both eyes  Without correction:     With correction: 20/25 20/30 20/20    [EKG Reading]: Sinus  Rhythm. Rate 67. PR 140. Within nl limits.  Dietary issues and exercise activities discussed: No exercise, poor diet.     Goals    None     Depression Screen PHQ 2/9 Scores 01/22/2016 05/23/2014  PHQ - 2 Score 0 0     Fall Risk Fall Risk  01/22/2016  Falls in the past year? No    Cognitive Function:  Normal      Patient Care Team: Shawnee Knapp, MD as PCP - General (Family Medicine)     Plan:     I have personally reviewed and  noted the following in the patient's chart:   . Medical and social history . Use of alcohol, tobacco or illicit drugs - none, no smoking, none . Current medications and supplements . Functional ability and status . Nutritional status . Physical activity . Advanced directives . List of other physicians . Hospitalizations, surgeries, and ER visits in previous 12 months . Vitals . Screenings to include cognitive, depression, and falls  . Referrals and appointments  In addition, I have reviewed and discussed with patient certain preventive protocols, quality metrics, and best practice recommendations. A written personalized care plan for preventive services as well as general preventive health recommendations were provided to patient.     [name], MD 09/03/2016

## 2016-09-03 NOTE — Progress Notes (Signed)
By signing my name below, I, [name], attest that this documentation has been prepared under the direction and in the presence of [name], MD.  Electronically Signed: [name], Medical Scribe. 09/03/16. 9:27 AM.  Subjective:    Patient ID: Theresa Villarreal, female    DOB: 05-May-1950, 66 y.o.   MRN: [id number]  HPI Chief Complaint  Patient presents with  . Annual Exam  . Medication Refill    ERGOCALCIFEROL    HPI Comments: Theresa Villarreal is a 66 y.o. female who presents to Primary Care at [facility] for her welcome to medicare visit. Pt is not taking any chronic medications but she does occasionally take a general OTC multivitamin.  ROS Complaints: See functional status survey below.   Cancer Screening: Cervical: She does not need a pap smear due to hysterectomy. She had her surgery due to fibroids - no precancerous reasons. Denies vaginal discharge or any urinary sxs.  Breast: Mammogram Dec 2017 at [facility]. Nl results. Colon: Pt has not received a colonoscopy but she would like to get it done. She has financial concerns about the test.  Bone Density Scan: No prior dexa scan.  HIV/Hep C Screening: Pt agrees to HIV and Hep C screening today.  Immunizations: She had pneumococcal unspecified Nov 2017, tetanus 2016. She has had her shingles vaccine at her pharmacy. Immunization History  Administered Date(s) Administered  . Influenza-Unspecified 01/08/2016  . Pneumococcal-Unspecified 01/08/2016  . Tdap 05/23/2014   Vision: Pt has not seen her ophthalmologist in the past year.  Visual Acuity Screening   Right eye Left eye Both eyes  Without correction:     With correction: 20/25 20/30 20/20    Dentist: Pt has not seen a dentist in the past year.  Exercise/Activity: Pt does not get out or go on walks much.  Wt Readings from Last 3 Encounters:  09/03/16 236 lb 6.4 oz (107.2 kg)  01/22/16 239 lb 3.2 oz (108.5 kg)  05/23/14 240 lb (108.9 kg)   Diet: Pt admits to a  poor diet and eating a lot of comfort foods. Pt does not drink alcohol or smoke. She does not use any illicit drugs.  Fall Screening: Fall Risk  09/03/2016 01/22/2016 05/23/2014  Falls in the past year? No No No   Functional Status Survey: Concentrating: Pt forgets where she puts things in the house, but no other changes in memory. Pt forgot where she was going when she went to visit her sister-in-law in the hospital (was a new hospital for her) Is the patient deaf or have difficulty hearing?: No Does the patient have difficulty seeing, even when wearing glasses/contacts?: Yes (SOMETIMES) Does the patient have difficulty concentrating, remembering, or making decisions?: Yes (SOMETIMES) Does the patient have difficulty walking or climbing stairs?: No Does the patient have difficulty dressing or bathing?: No Does the patient have difficulty doing errands alone such as visiting a doctor's office or shopping?: No   Advance Directive: Pt does not have a HCPOA or living will. She has 3 kids. She does want to have medical intervention if she has the opportunity to get better. She doesn't want to be a vegetable or be on a ventilator and states "if it's my time then it's my time".  Depression Screening: Depression screen [facility] 2/9 09/03/2016 01/22/2016 05/23/2014  Decreased Interest 0 0 0  Down, Depressed, Hopeless 0 0 0  PHQ - 2 Score 0 0 0   Patient Active Problem List   Diagnosis Date Noted  .  Vitamin D deficiency 06/23/2014  . Caregiver with fatigue 06/23/2014   No past medical history on file. Past Surgical History:  Procedure Laterality Date  . ABDOMINAL HYSTERECTOMY    . TUBAL LIGATION     No Known Allergies Prior to Admission medications   Medication Sig Start Date End Date Taking? Authorizing Provider  ergocalciferol (VITAMIN D2) 50000 units capsule Take 1 capsule (50,000 Units total) by mouth once a week. 07/18/16  Yes Shawnee Knapp, MD  traMADol (ULTRAM) 50 MG tablet Take 1 tablet (50 mg  total) by mouth every 8 (eight) hours as needed. 01/22/16  Yes Shawnee Knapp, MD  cyclobenzaprine (FLEXERIL) 10 MG tablet Take 1 tablet (10 mg total) by mouth 3 (three) times daily as needed for muscle spasms. Patient not taking: Reported on 09/03/2016 01/22/16   Shawnee Knapp, MD  diclofenac (VOLTAREN) 75 MG EC tablet Take 1 tablet (75 mg total) by mouth 2 (two) times daily. Patient not taking: Reported on 09/03/2016 01/22/16   Shawnee Knapp, MD  zoster vaccine live, PF, (ZOSTAVAX) 96759 UNT/0.65ML injection Inject 19,400 Units into the skin once. Patient not taking: Reported on 01/22/2016 12/21/14   Shawnee Knapp, MD   Social History   Social History  . Marital status: Married    Spouse name: N/A  . Number of children: N/A  . Years of education: N/A   Occupational History  . Not on file.   Social History Main Topics  . Smoking status: Never Smoker  . Smokeless tobacco: Never Used  . Alcohol use No  . Drug use: No  . Sexual activity: Not on file   Other Topics Concern  . Not on file   Social History Narrative  . No narrative on file   Family History  Problem Relation Age of Onset  . Hypertension Mother   . Multiple sclerosis Mother   . Hypertension Father    Depression screen [facility] 2/9 09/03/2016 01/22/2016 05/23/2014  Decreased Interest 0 0 0  Down, Depressed, Hopeless 0 0 0  PHQ - 2 Score 0 0 0    Review of Systems  Psychiatric/Behavioral: Positive for decreased concentration.  All other systems reviewed and are negative. 13 point ROS positive for the above, otherwise negative. Objective:  Physical Exam  Constitutional: She appears well-developed and well-nourished. No distress.  HENT:  Head: Normocephalic and atraumatic.  Mouth/Throat: Normal dentition (basic).  Eyes: Conjunctivae are normal.  Neck: Neck supple. No thyroid mass and no thyromegaly present.  Cardiovascular: Normal rate, regular rhythm, S1 normal, S2 normal and normal heart sounds.  Exam reveals no gallop, no  distant heart sounds and no friction rub.   No murmur heard. Pulmonary/Chest: Effort normal and breath sounds normal. No respiratory distress. She has no decreased breath sounds. She has no wheezes. She has no rhonchi. She has no rales.  Breast exam nl  Abdominal: Soft. She exhibits no distension. There is no tenderness.  Genitourinary: Right adnexum displays no mass, no tenderness and no fullness. Left adnexum displays no mass, no tenderness and no fullness.  Genitourinary Comments: Nl hyperpigmentation of perineum Vaginal with mild amount of thin white vaginal discharge with very strong amine odor cervix surgically absent Uterus surgically absent  Neurological: She is alert.  Skin: Skin is warm and dry.  Psychiatric: She has a normal mood and affect. Her behavior is normal.  Nursing note and vitals reviewed.   Vitals:   09/03/16 0913  BP: 130/68  Pulse: 76  Resp:  16  Temp: 99 F (37.2 C)  TempSrc: Oral  SpO2: 97%  Weight: 236 lb 6.4 oz (107.2 kg)  Height: 5' 8.5" (1.74 m)   Body mass index is 35.42 kg/m.   [EKG Reading]: Sinus  Rhythm. Rate 67. PR 140. Within nl limits.  Results for orders placed or performed in visit on 09/03/16  POCT urinalysis dipstick  Result Value Ref Range   Color, UA yellow yellow   Clarity, UA clear clear   Glucose, UA negative negative mg/dL   Bilirubin, UA negative negative   Ketones, POC UA negative negative mg/dL   Spec Grav, UA >=1.030 (A) 1.010 - 1.025   Blood, UA negative negative   pH, UA 5.5 5.0 - 8.0   Protein Ur, POC negative negative mg/dL   Urobilinogen, UA 0.2 0.2 or 1.0 E.U./dL   Nitrite, UA Negative Negative   Leukocytes, UA Negative Negative   Assessment & Plan:   1. Welcome to Medicare preventive visit   2. Routine screening for STI (sexually transmitted infection)   3. Encounter for screening mammogram for breast cancer   4. Screening for cardiovascular, respiratory, and genitourinary diseases   5. Screening for  colorectal cancer   6. Vitamin D deficiency   7. Asymptomatic microscopic hematuria   8. Fatigue, unspecified type   9. Bacterial vaginosis   10. Postmenopausal estrogen deficiency     Orders Placed This Encounter  Procedures  . DG Bone Density    Standing Status:   Future    Standing Expiration Date:   03/06/2018    Scheduling Instructions:     Sched to coincide with next mammogram - likely Dec 2018    Order Specific Question:   Reason for Exam (SYMPTOM  OR DIAGNOSIS REQUIRED)    Answer:   postmenopausal estrogen deficiency    Order Specific Question:   Preferred imaging location?    Answer:   External    Comments:   [facility]  . Lipid panel    Order Specific Question:   Has the patient fasted?    Answer:   Yes  . CBC  . Comprehensive metabolic panel    Order Specific Question:   Has the patient fasted?    Answer:   Yes  . TSH  . HCV Ab w/Rflx to Verification  . HIV antibody  . VITAMIN D 25 Hydroxy (Vit-D Deficiency, Fractures)  . Interpretation:  . Ambulatory referral to Gastroenterology    Referral Priority:   Routine    Referral Type:   Consultation    Referral Reason:   Specialty Services Required    Number of Visits Requested:   1  . POCT urinalysis dipstick  . POCT Wet + KOH Prep  . EKG 12-Lead    Meds ordered this encounter  Medications  . ergocalciferol (VITAMIN D2) 50000 units capsule    Sig: Take 1 capsule (50,000 Units total) by mouth once a week.    Dispense:  12 capsule    Refill:  1  . metroNIDAZOLE (FLAGYL) 500 MG tablet    Sig: Take 1 tablet (500 mg total) by mouth 2 (two) times daily.    Dispense:  14 tablet    Refill:  0  . Zoster Vac Recomb Adjuvanted (SHINGRIX) injection    Sig: Inject 0.5 mLs into the muscle once. Repeat in 2-6 mos.    Dispense:  0.5 mL    Refill:  1    I personally performed the services described in this documentation, which  was scribed in my presence. The recorded information has been reviewed and considered, and addended  by me as needed.   [name], M.D.  Primary Care at [facility] [address] [place], [place] [address] [phone] phone [phone] fax  09/05/16 11:23 PM

## 2016-09-05 ENCOUNTER — Encounter: Payer: Self-pay | Admitting: Gastroenterology

## 2016-09-05 LAB — COMPREHENSIVE METABOLIC PANEL
ALT: 18 IU/L (ref 0–32)
AST: 20 IU/L (ref 0–40)
Albumin/Globulin Ratio: 1.4 (ref 1.2–2.2)
Albumin: 4.1 g/dL (ref 3.6–4.8)
Alkaline Phosphatase: 76 IU/L (ref 39–117)
BUN / CREAT RATIO: 10 — AB (ref 12–28)
BUN: 11 mg/dL (ref 8–27)
Bilirubin Total: 0.4 mg/dL (ref 0.0–1.2)
CALCIUM: 9.4 mg/dL (ref 8.7–10.3)
CO2: 22 mmol/L (ref 20–29)
Chloride: 106 mmol/L (ref 96–106)
Creatinine, Ser: 1.11 mg/dL — ABNORMAL HIGH (ref 0.57–1.00)
GFR calc Af Amer: 60 mL/min/{1.73_m2} (ref >59)
GFR calc non Af Amer: 52 mL/min/{1.73_m2} — ABNORMAL LOW (ref >59)
Globulin, Total: 3 g/dL (ref 1.5–4.5)
Glucose: 94 mg/dL (ref 65–99)
Potassium: 4.5 mmol/L (ref 3.5–5.2)
Sodium: 143 mmol/L (ref 134–144)
TOTAL PROTEIN: 7.1 g/dL (ref 6.0–8.5)

## 2016-09-05 LAB — LIPID PANEL
Chol/HDL Ratio: 3.3 ratio (ref 0.0–4.4)
Cholesterol, Total: 163 mg/dL (ref 100–199)
HDL: 49 mg/dL (ref >39)
LDL CALC: 102 mg/dL — AB (ref 0–99)
Triglycerides: 61 mg/dL (ref 0–149)
VLDL Cholesterol Cal: 12 mg/dL (ref 5–40)

## 2016-09-05 LAB — CBC
Hematocrit: 38.5 % (ref 34.0–46.6)
Hemoglobin: 12.5 g/dL (ref 11.1–15.9)
MCH: 28.2 pg (ref 26.6–33.0)
MCHC: 32.5 g/dL (ref 31.5–35.7)
MCV: 87 fL (ref 79–97)
Platelets: 262 10*3/uL (ref 150–379)
RBC: 4.43 x10E6/uL (ref 3.77–5.28)
RDW: 14.7 % (ref 12.3–15.4)
WBC: 4.2 10*3/uL (ref 3.4–10.8)

## 2016-09-05 LAB — HCV INTERPRETATION

## 2016-09-05 LAB — HCV AB W/RFLX TO VERIFICATION: HCV Ab: 0.2 s/co ratio (ref 0.0–0.9)

## 2016-09-05 LAB — TSH: TSH: 1.67 u[IU]/mL (ref 0.450–4.500)

## 2016-09-05 LAB — VITAMIN D 25 HYDROXY (VIT D DEFICIENCY, FRACTURES): VIT D 25 HYDROXY: 23.7 ng/mL — AB (ref 30.0–100.0)

## 2016-09-05 LAB — HIV ANTIBODY (ROUTINE TESTING W REFLEX): HIV SCREEN 4TH GENERATION: NONREACTIVE

## 2016-10-18 ENCOUNTER — Ambulatory Visit (AMBULATORY_SURGERY_CENTER): Payer: Self-pay

## 2016-10-18 VITALS — Ht 70.0 in | Wt 232.4 lb

## 2016-10-18 DIAGNOSIS — Z1211 Encounter for screening for malignant neoplasm of colon: Secondary | ICD-10-CM

## 2016-10-18 MED ORDER — NA SULFATE-K SULFATE-MG SULF 17.5-3.13-1.6 GM/177ML PO SOLN: ORAL | 0 refills | Status: DC

## 2016-10-18 NOTE — Progress Notes (Signed)
Per pt, no allergies to soy or egg products.Pt not taking any weight loss meds or using  O2 at home.   Unable to set up Emmi video, due to pt does not have e-mail!

## 2016-10-27 ENCOUNTER — Encounter: Payer: Self-pay | Admitting: Gastroenterology

## 2016-11-01 ENCOUNTER — Telehealth: Payer: Self-pay | Admitting: Family Medicine

## 2016-11-01 NOTE — Telephone Encounter (Signed)
Pt is calling to check on the prior auth for her colonoscopy.  She states that she will be going to [facility] and the prior auth number for insurance is [id number].  Thank you!!

## 2016-11-01 NOTE — Telephone Encounter (Signed)
Called and spoke with Amy in the pre-cert center at [facility] to see if she knew anything about this prior auth. She said prior Josem Kaufmann is not required to see them with [facility]. I called the pt to let her know this but she said her insurance company said prior auth was needed. I called [employer] at the number listed above, and they said prior Josem Kaufmann is not required for office visits. The reference number for this call is [id number]

## 2016-11-08 ENCOUNTER — Encounter: Payer: Self-pay | Admitting: Gastroenterology

## 2016-11-08 ENCOUNTER — Ambulatory Visit (AMBULATORY_SURGERY_CENTER): Payer: Medicare PPO | Admitting: Gastroenterology

## 2016-11-08 VITALS — BP 129/72 | HR 68 | Temp 98.3°F | Resp 14 | Ht 70.0 in | Wt 232.0 lb

## 2016-11-08 DIAGNOSIS — Z1212 Encounter for screening for malignant neoplasm of rectum: Secondary | ICD-10-CM | POA: Diagnosis not present

## 2016-11-08 DIAGNOSIS — D128 Benign neoplasm of rectum: Secondary | ICD-10-CM

## 2016-11-08 DIAGNOSIS — Z1211 Encounter for screening for malignant neoplasm of colon: Secondary | ICD-10-CM

## 2016-11-08 DIAGNOSIS — K621 Rectal polyp: Secondary | ICD-10-CM

## 2016-11-08 DIAGNOSIS — D123 Benign neoplasm of transverse colon: Secondary | ICD-10-CM

## 2016-11-08 MED ORDER — SODIUM CHLORIDE 0.9 % IV SOLN: 500.0000 mL | INTRAVENOUS | Status: DC

## 2016-11-08 NOTE — Progress Notes (Signed)
Report to PACU, RN, vss, BBS= Clear.  

## 2016-11-08 NOTE — Op Note (Signed)
[facility] Patient Name: Theresa Villarreal Procedure Date: 11/08/2016 11:14 AM MRN: [id number] Endoscopist: Remo Lipps P. Miquela Costabile MD, MD Age: 66 Referring MD:  Date of Birth: 1950/12/10 Gender: Female Account #: [id number] Procedure:                Colonoscopy Indications:              Screening for colorectal malignant neoplasm, This                            is the patient's first colonoscopy Medicines:                Monitored Anesthesia Care Procedure:                Pre-Anesthesia Assessment:                           - Prior to the procedure, a History and Physical                            was performed, and patient medications and                            allergies were reviewed. The patient's tolerance of                            previous anesthesia was also reviewed. The risks                            and benefits of the procedure and the sedation                            options and risks were discussed with the patient.                            All questions were answered, and informed consent                            was obtained. Prior Anticoagulants: The patient has                            taken no previous anticoagulant or antiplatelet                            agents. ASA Grade Assessment: II - A patient with                            mild systemic disease. After reviewing the risks                            and benefits, the patient was deemed in                            satisfactory condition to undergo the procedure.  After obtaining informed consent, the colonoscope                            was passed under direct vision. Throughout the                            procedure, the patient's blood pressure, pulse, and                            oxygen saturations were monitored continuously. The                            Colonoscope was introduced through the anus and                            advanced to the the  cecum, identified by                            appendiceal orifice and ileocecal valve. The                            colonoscopy was performed without difficulty. The                            patient tolerated the procedure well. The quality                            of the bowel preparation was good. The ileocecal                            valve, appendiceal orifice, and rectum were                            photographed. Scope In: 11:25:34 AM Scope Out: 11:47:35 AM Scope Withdrawal Time: 0 hours 15 minutes 1 second  Total Procedure Duration: 0 hours 22 minutes 1 second  Findings:                 The perianal and digital rectal examinations were                            normal.                           Two sessile polyps were found in the transverse                            colon. The polyps were 2 to 3 mm in size. These                            polyps were removed with a cold biopsy forceps.                            Resection and retrieval were complete.  A 15 mm polyp was found in the splenic flexure. The                            polyp was semi-pedunculated. The polyp was removed                            with a hot snare. Resection and retrieval were                            complete. The polypectomy site had a prominent                            small vessel at the base of it. To prevent bleeding                            after the polypectomy, the vessel was cauterized                            and one hemostatic clip was successfully placed                            across the defect. There was no bleeding during, or                            at the end, of the procedure.                           A 3 mm polyp was found in the rectum. The polyp was                            sessile. The polyp was removed with a cold biopsy                            forceps. Resection and retrieval were complete.                            Internal hemorrhoids were found during retroflexion.                           The exam was otherwise without abnormality. Complications:            No immediate complications. Estimated blood loss:                            Minimal. Estimated Blood Loss:     Estimated blood loss was minimal. Impression:               - Two 2 to 3 mm polyps in the transverse colon,                            removed with a cold biopsy forceps. Resected and  retrieved.                           - One 15 mm polyp at the splenic flexure, removed                            with a hot snare. Resected and retrieved. Clip was                            placed.                           - One 3 mm polyp in the rectum, removed with a cold                            biopsy forceps. Resected and retrieved.                           - Internal hemorrhoids.                           - The examination was otherwise normal. Recommendation:           - Patient has a contact number available for                            emergencies. The signs and symptoms of potential                            delayed complications were discussed with the                            patient. Return to normal activities tomorrow.                            Written discharge instructions were provided to the                            patient.                           - Resume previous diet.                           - Continue present medications.                           - Await pathology results.                           - Repeat colonoscopy is recommended for                            surveillance. The colonoscopy date will be                            determined after pathology results from  today's                            exam become available for review.                           - No ibuprofen, naproxen, or other non-steroidal                            anti-inflammatory drugs for 2 weeks after  polyp                            removal. Remo Lipps P. Aristotle Lieb MD, MD 11/08/2016 11:55:13 AM This report has been signed electronically.

## 2016-11-08 NOTE — Progress Notes (Signed)
Called to room to assist during endoscopic procedure.  Patient ID and intended procedure confirmed with present staff. Received instructions for my participation in the procedure from the performing physician.  

## 2016-11-08 NOTE — Patient Instructions (Signed)
**  Handouts given on polyps and hemorrhoids**   YOU HAD AN ENDOSCOPIC PROCEDURE TODAY: Refer to the procedure report and other information in the discharge instructions given to you for any specific questions about what was found during the examination. If this information does not answer your questions, please call [place] office at [phone] to clarify.   YOU SHOULD EXPECT: Some feelings of bloating in the abdomen. Passage of more gas than usual. Walking can help get rid of the air that was put into your GI tract during the procedure and reduce the bloating. If you had a lower endoscopy (such as a colonoscopy or flexible sigmoidoscopy) you may notice spotting of blood in your stool or on the toilet paper. Some abdominal soreness may be present for a day or two, also.  DIET: Your first meal following the procedure should be a light meal and then it is ok to progress to your normal diet. A half-sandwich or bowl of soup is an example of a good first meal. Heavy or fried foods are harder to digest and may make you feel nauseous or bloated. Drink plenty of fluids but you should avoid alcoholic beverages for 24 hours. If you had a esophageal dilation, please see attached instructions for diet.    ACTIVITY: Your care partner should take you home directly after the procedure. You should plan to take it easy, moving slowly for the rest of the day. You can resume normal activity the day after the procedure however YOU SHOULD NOT DRIVE, use power tools, machinery or perform tasks that involve climbing or major physical exertion for 24 hours (because of the sedation medicines used during the test).   SYMPTOMS TO REPORT IMMEDIATELY: A gastroenterologist can be reached at any hour. Please call [phone]  for any of the following symptoms:  Following lower endoscopy (colonoscopy, flexible sigmoidoscopy) Excessive amounts of blood in the stool  Significant tenderness, worsening of abdominal pains  Swelling of  the abdomen that is new, acute  Fever of 100 or higher    FOLLOW UP:  If any biopsies were taken you will be contacted by phone or by letter within the next 1-3 weeks. Call [phone]  if you have not heard about the biopsies in 3 weeks.  Please also call with any specific questions about appointments or follow up tests.  

## 2016-11-09 ENCOUNTER — Telehealth: Payer: Self-pay

## 2016-11-09 NOTE — Telephone Encounter (Signed)
  Follow up Call-  Call back number 11/08/2016  Post procedure Call Back phone  # [phone]  Permission to leave phone message Yes  Some recent data might be hidden     Patient questions:  Do you have a fever, pain , or abdominal swelling? No. Pain Score  0 *  Have you tolerated food without any problems? Yes.    Have you been able to return to your normal activities? Yes.    Do you have any questions about your discharge instructions: Diet   No. Medications  No. Follow up visit  No.  Do you have questions or concerns about your Care? No.  Actions: * If pain score is 4 or above: No action needed, pain <4.

## 2016-11-15 ENCOUNTER — Encounter: Payer: Self-pay | Admitting: Gastroenterology

## 2017-02-13 ENCOUNTER — Encounter: Payer: Self-pay | Admitting: Family Medicine

## 2017-02-13 DIAGNOSIS — Z780 Asymptomatic menopausal state: Secondary | ICD-10-CM | POA: Diagnosis not present

## 2017-02-13 DIAGNOSIS — Z1231 Encounter for screening mammogram for malignant neoplasm of breast: Secondary | ICD-10-CM | POA: Diagnosis not present

## 2017-02-13 DIAGNOSIS — M8589 Other specified disorders of bone density and structure, multiple sites: Secondary | ICD-10-CM | POA: Diagnosis not present

## 2017-02-13 LAB — HM MAMMOGRAPHY

## 2017-02-22 DIAGNOSIS — N6002 Solitary cyst of left breast: Secondary | ICD-10-CM | POA: Diagnosis not present

## 2017-03-08 ENCOUNTER — Encounter: Payer: Self-pay | Admitting: Family Medicine

## 2017-03-08 DIAGNOSIS — M8580 Other specified disorders of bone density and structure, unspecified site: Secondary | ICD-10-CM | POA: Insufficient documentation

## 2017-04-05 ENCOUNTER — Encounter: Payer: Self-pay | Admitting: Physician Assistant

## 2017-04-05 ENCOUNTER — Ambulatory Visit: Payer: Medicare PPO | Admitting: Physician Assistant

## 2017-04-05 ENCOUNTER — Other Ambulatory Visit: Payer: Self-pay

## 2017-04-05 VITALS — BP 120/80 | HR 87 | Temp 98.3°F | Resp 16 | Ht 69.0 in | Wt 239.0 lb

## 2017-04-05 DIAGNOSIS — R05 Cough: Secondary | ICD-10-CM

## 2017-04-05 DIAGNOSIS — R6889 Other general symptoms and signs: Secondary | ICD-10-CM | POA: Diagnosis not present

## 2017-04-05 DIAGNOSIS — R059 Cough, unspecified: Secondary | ICD-10-CM

## 2017-04-05 LAB — POC INFLUENZA A&B (BINAX/QUICKVUE)
Influenza A, POC: NEGATIVE
Influenza B, POC: NEGATIVE

## 2017-04-05 MED ORDER — BENZONATATE 100 MG PO CAPS: 100.0000 mg | ORAL_CAPSULE | Freq: Three times a day (TID) | ORAL | 0 refills | Status: DC | PRN

## 2017-04-05 NOTE — Patient Instructions (Addendum)
You are negative for the flu.  I am treating you today for a viral cough.  Stay well hydrated. Get lost of rest. Wash your hands often.   -Foods that can help speed recovery: honey, garlic, chicken soup, elderberries, green tea.  -Supplements that can help speed recovery: vitamin C, zinc, elderberry extract, quercetin, ginseng, selenium -Supplement with prebiotics and probiotics:   Advil or ibuprofen for pain. Do not take Aspirin.  Drink enough water and fluids to keep your urine clear or pale yellow.  For sore throat try using a honey-based tea. Use 3 teaspoons of honey with juice squeezed from half lemon. Place shaved pieces of ginger into 1/2-1 cup of water and warm over stove top. Then mix the ingredients and repeat every 4 hours as needed.  Cough Syrup Recipe: Sweet Lemon & Honey Thyme  Ingredients a handful of fresh thyme sprigs   1 pint of water (2 cups)  1/2 cup honey (raw is best, but regular will do)  1/2 lemon chopped Instructions 1. Place the lemon in the pint jar and cover with the honey. The honey will macerate the lemons and draw out liquids which taste so delicious! 2. Meanwhile, toss the thyme leaves into a saucepan and cover them with the water. 3. Bring the water to a gentle simmer and reduce it to half, about a cup of tea. 4. When the tea is reduced and cooled a bit, strain the sprigs & leaves, add it into the pint jar and stir it well. 5. Give it a shake and use a spoonful as needed. 6. Store your homemade cough syrup in the refrigerator for about a month.  What causes a cough? In adults, common causes of a cough include: ?An infection of the airways or lungs (such as the common cold) ?Postnasal drip - Postnasal drip is when mucus from the nose drips down or flows along the back of the throat. Postnasal drip can happen when people have: .A cold .Allergies .A sinus infection - The sinuses are hollow areas in the bones of the face that open into the nose. ?Lung  conditions, like asthma and chronic obstructive pulmonary disease (COPD) - Both of these conditions can make it hard to breathe. COPD is usually caused by smoking. ?Acid reflux - Acid reflux is when the acid that is normally in your stomach backs up into your esophagus (the tube that carries food from your mouth to your stomach). ?A side effect from blood pressure medicines called "ACE inhibitors" ?Smoking cigarettes  Is there anything I can do on my own to get rid of my cough? Yes. To help get rid of your cough, you can: ?Use a humidifier in your bedroom ?Use an over-the-counter cough medicine, or suck on cough drops or hard candy ?Stop smoking, if you smoke ?If you have allergies, avoid the things you are allergic to (like pollen, dust, animals, or mold) If you have acid reflux, your doctor or nurse will tell you which lifestyle changes can help reduce symptoms.     IF you received an x-ray today, you will receive an invoice from [facility]. Please contact [facility] at [phone] with questions or concerns regarding your invoice.   IF you received labwork today, you will receive an invoice from [facility]. Please contact [facility] at [phone] with questions or concerns regarding your invoice.   Our billing staff will not be able to assist you with questions regarding bills from these companies.  You will be contacted with the  lab results as soon as they are available. The fastest way to get your results is to activate your My Chart account. Instructions are located on the last page of this paperwork. If you have not heard from us regarding the results in 2 weeks, please contact this office.     

## 2017-04-05 NOTE — Progress Notes (Signed)
Theresa Villarreal  MRN: [id number] DOB: 17-May-1950  PCP: Shawnee Knapp, MD  Subjective:  Pt is a 67 year old female who presents to clinic for cough x 5 days.  She is sleeping well. She is feeling better than she did a few days ago when she had sore throat and fatigue.  She has been taking tussion cough medication, honey and lemon.  Her husband was diagnosed with the flu yesterday. He is now in the hospital with the flu.  She denies fever, chills, chest pain, shob.   Review of Systems  Constitutional: Positive for fatigue. Negative for chills, diaphoresis and fever.  HENT: Positive for congestion, postnasal drip and rhinorrhea. Negative for sinus pressure, sinus pain and sore throat.   Respiratory: Positive for cough. Negative for shortness of breath and wheezing.   Psychiatric/Behavioral: Negative for sleep disturbance.    Patient Active Problem List   Diagnosis Date Noted  . Osteopenia 03/08/2017  . Vitamin D deficiency 06/23/2014  . Caregiver with fatigue 06/23/2014    Current Outpatient Medications on File Prior to Visit  Medication Sig Dispense Refill  . ibuprofen (ADVIL,MOTRIN) 200 MG tablet Take 200 mg by mouth as needed.    . ergocalciferol (VITAMIN D2) 50000 units capsule Take 1 capsule (50,000 Units total) by mouth once a week. (Patient not taking: Reported on 04/05/2017) 12 capsule 1   Current Facility-Administered Medications on File Prior to Visit  Medication Dose Route Frequency Provider Last Rate Last Dose  . 0.9 %  sodium chloride infusion  500 mL Intravenous Continuous Armbruster, Carlota Raspberry, MD        No Known Allergies   Objective:  BP 120/80   Pulse 87   Temp 98.3 F (36.8 C) (Oral)   Resp 16   Ht 5\' 9"  (1.753 m)   Wt 239 lb (108.4 kg)   SpO2 95%   BMI 35.29 kg/m   Physical Exam  Constitutional: She is oriented to person, place, and time and well-developed, well-nourished, and in no distress. No distress.  HENT:  Right Ear: Tympanic membrane normal.    Left Ear: Tympanic membrane normal.  Nose: Mucosal edema present. No rhinorrhea. Right sinus exhibits no maxillary sinus tenderness and no frontal sinus tenderness. Left sinus exhibits no maxillary sinus tenderness and no frontal sinus tenderness.  Mouth/Throat: Oropharynx is clear and moist and mucous membranes are normal.  Cardiovascular: Normal rate, regular rhythm and normal heart sounds.  Pulmonary/Chest: Effort normal and breath sounds normal. No respiratory distress. She has no wheezes. She has no rales.  Neurological: She is alert and oriented to person, place, and time. GCS score is 15.  Skin: Skin is warm and dry.  Psychiatric: Mood, memory, affect and judgment normal.  Vitals reviewed.   Results for orders placed or performed in visit on 04/05/17  POC Influenza A&B (Binax test)  Result Value Ref Range   Influenza A, POC Negative Negative   Influenza B, POC Negative Negative    Assessment and Plan :  1. Flu-like symptoms 2. Cough - POC Influenza A&B (Binax test) - benzonatate (TESSALON) 100 MG capsule; Take 1-2 capsules (100-200 mg total) by mouth 3 (three) times daily as needed for cough.  Dispense: 40 capsule; Refill: 0 - pt presents for cough x 5 days. Her husband is currently in the hospital for the flu. No concerning findings today, her vitals are stable. Plan to treat supportively. RTC in 5 days if no improvement or worsening symptoms.  Mercer Pod, PA-C  [facility] [place] 04/05/2017 3:23 PM

## 2017-07-29 ENCOUNTER — Ambulatory Visit: Payer: Medicare PPO

## 2017-07-29 ENCOUNTER — Other Ambulatory Visit: Payer: Self-pay

## 2017-07-29 VITALS — BP 102/68 | HR 76 | Ht 68.31 in | Wt 242.8 lb

## 2017-07-29 DIAGNOSIS — Z0000 Encounter for general adult medical examination without abnormal findings: Secondary | ICD-10-CM

## 2017-07-29 NOTE — Patient Instructions (Addendum)
Pneumonia shot today Advanced directive given to review Referral to Dietician to discuss nutrition for weight loss -If you can, get a notebook and write down when you are eating sweets and what your emotions are. What else would meet that emotional need besides sweets?   Health Maintenance, Female Adopting a healthy lifestyle and getting preventive care can go a long way to promote health and wellness. Talk with your health care provider about what schedule of regular examinations is right for you. This is a good chance for you to check in with your provider about disease prevention and staying healthy. In between checkups, there are plenty of things you can do on your own. Experts have done a lot of research about which lifestyle changes and preventive measures are most likely to keep you healthy. Ask your health care provider for more information. Weight and diet Eat a healthy diet  Be sure to include plenty of vegetables, fruits, low-fat dairy products, and lean protein.  Do not eat a lot of foods high in solid fats, added sugars, or salt.  Get regular exercise. This is one of the most important things you can do for your health. ? Most adults should exercise for at least 150 minutes each week. The exercise should increase your heart rate and make you sweat (moderate-intensity exercise). ? Most adults should also do strengthening exercises at least twice a week. This is in addition to the moderate-intensity exercise.  Maintain a healthy weight  Body mass index (BMI) is a measurement that can be used to identify possible weight problems. It estimates body fat based on height and weight. Your health care provider can help determine your BMI and help you achieve or maintain a healthy weight.  For females 67 years of age and older: ? A BMI below 18.5 is considered underweight. ? A BMI of 18.5 to 24.9 is normal. ? A BMI of 25 to 29.9 is considered overweight. ? A BMI of 30 and above is  considered obese.  Watch levels of cholesterol and blood lipids  You should start having your blood tested for lipids and cholesterol at 67 years of age, then have this test every 5 years.  You may need to have your cholesterol levels checked more often if: ? Your lipid or cholesterol levels are high. ? You are older than 67 years of age. ? You are at high risk for heart disease.  Cancer screening Lung Cancer  Lung cancer screening is recommended for adults 49-62 years old who are at high risk for lung cancer because of a history of smoking.  A yearly low-dose CT scan of the lungs is recommended for people who: ? Currently smoke. ? Have quit within the past 15 years. ? Have at least a 30-pack-year history of smoking. A pack year is smoking an average of one pack of cigarettes a day for 1 year.  Yearly screening should continue until it has been 15 years since you quit.  Yearly screening should stop if you develop a health problem that would prevent you from having lung cancer treatment.  Breast Cancer  Practice breast self-awareness. This means understanding how your breasts normally appear and feel.  It also means doing regular breast self-exams. Let your health care provider know about any changes, no matter how small.  If you are in your 20s or 30s, you should have a clinical breast exam (CBE) by a health care provider every 1-3 years as part of a regular health  exam.  If you are 40 or older, have a CBE every year. Also consider having a breast X-ray (mammogram) every year.  If you have a family history of breast cancer, talk to your health care provider about genetic screening.  If you are at high risk for breast cancer, talk to your health care provider about having an MRI and a mammogram every year.  Breast cancer gene (BRCA) assessment is recommended for women who have family members with BRCA-related cancers. BRCA-related cancers  include: ? Breast. ? Ovarian. ? Tubal. ? Peritoneal cancers.  Results of the assessment will determine the need for genetic counseling and BRCA1 and BRCA2 testing.  Cervical Cancer Your health care provider may recommend that you be screened regularly for cancer of the pelvic organs (ovaries, uterus, and vagina). This screening involves a pelvic examination, including checking for microscopic changes to the surface of your cervix (Pap test). You may be encouraged to have this screening done every 3 years, beginning at age 55.  For women ages 59-65, health care providers may recommend pelvic exams and Pap testing every 3 years, or they may recommend the Pap and pelvic exam, combined with testing for human papilloma virus (HPV), every 5 years. Some types of HPV increase your risk of cervical cancer. Testing for HPV may also be done on women of any age with unclear Pap test results.  Other health care providers may not recommend any screening for nonpregnant women who are considered low risk for pelvic cancer and who do not have symptoms. Ask your health care provider if a screening pelvic exam is right for you.  If you have had past treatment for cervical cancer or a condition that could lead to cancer, you need Pap tests and screening for cancer for at least 20 years after your treatment. If Pap tests have been discontinued, your risk factors (such as having a new sexual partner) need to be reassessed to determine if screening should resume. Some women have medical problems that increase the chance of getting cervical cancer. In these cases, your health care provider may recommend more frequent screening and Pap tests.  Colorectal Cancer  This type of cancer can be detected and often prevented.  Routine colorectal cancer screening usually begins at 67 years of age and continues through 67 years of age.  Your health care provider may recommend screening at an earlier age if you have risk factors  for colon cancer.  Your health care provider may also recommend using home test kits to check for hidden blood in the stool.  A small camera at the end of a tube can be used to examine your colon directly (sigmoidoscopy or colonoscopy). This is done to check for the earliest forms of colorectal cancer.  Routine screening usually begins at age 75.  Direct examination of the colon should be repeated every 5-10 years through 67 years of age. However, you may need to be screened more often if early forms of precancerous polyps or small growths are found.  Skin Cancer  Check your skin from head to toe regularly.  Tell your health care provider about any new moles or changes in moles, especially if there is a change in a mole's shape or color.  Also tell your health care provider if you have a mole that is larger than the size of a pencil eraser.  Always use sunscreen. Apply sunscreen liberally and repeatedly throughout the day.  Protect yourself by wearing long sleeves, pants, a wide-brimmed  hat, and sunglasses whenever you are outside.  Heart disease, diabetes, and high blood pressure  High blood pressure causes heart disease and increases the risk of stroke. High blood pressure is more likely to develop in: ? People who have blood pressure in the high end of the normal range (130-139/85-89 mm Hg). ? People who are overweight or obese. ? People who are African American.  If you are 77-97 years of age, have your blood pressure checked every 3-5 years. If you are 30 years of age or older, have your blood pressure checked every year. You should have your blood pressure measured twice-once when you are at a hospital or clinic, and once when you are not at a hospital or clinic. Record the average of the two measurements. To check your blood pressure when you are not at a hospital or clinic, you can use: ? An automated blood pressure machine at a pharmacy. ? A home blood pressure monitor.  If  you are between 7 years and 48 years old, ask your health care provider if you should take aspirin to prevent strokes.  Have regular diabetes screenings. This involves taking a blood sample to check your fasting blood sugar level. ? If you are at a normal weight and have a low risk for diabetes, have this test once every three years after 67 years of age. ? If you are overweight and have a high risk for diabetes, consider being tested at a younger age or more often. Preventing infection Hepatitis B  If you have a higher risk for hepatitis B, you should be screened for this virus. You are considered at high risk for hepatitis B if: ? You were born in a country where hepatitis B is common. Ask your health care provider which countries are considered high risk. ? Your parents were born in a high-risk country, and you have not been immunized against hepatitis B (hepatitis B vaccine). ? You have HIV or AIDS. ? You use needles to inject street drugs. ? You live with someone who has hepatitis B. ? You have had sex with someone who has hepatitis B. ? You get hemodialysis treatment. ? You take certain medicines for conditions, including cancer, organ transplantation, and autoimmune conditions.  Hepatitis C  Blood testing is recommended for: ? Everyone born from [birth year] through 1965. ? Anyone with known risk factors for hepatitis C.  Sexually transmitted infections (STIs)  You should be screened for sexually transmitted infections (STIs) including gonorrhea and chlamydia if: ? You are sexually active and are younger than 67 years of age. ? You are older than 67 years of age and your health care provider tells you that you are at risk for this type of infection. ? Your sexual activity has changed since you were last screened and you are at an increased risk for chlamydia or gonorrhea. Ask your health care provider if you are at risk.  If you do not have HIV, but are at risk, it may be recommended  that you take a prescription medicine daily to prevent HIV infection. This is called pre-exposure prophylaxis (PrEP). You are considered at risk if: ? You are sexually active and do not regularly use condoms or know the HIV status of your partner(s). ? You take drugs by injection. ? You are sexually active with a partner who has HIV.  Talk with your health care provider about whether you are at high risk of being infected with HIV. If you choose to begin  PrEP, you should first be tested for HIV. You should then be tested every 3 months for as long as you are taking PrEP. Pregnancy  If you are premenopausal and you may become pregnant, ask your health care provider about preconception counseling.  If you may become pregnant, take 400 to 800 micrograms (mcg) of folic acid every day.  If you want to prevent pregnancy, talk to your health care provider about birth control (contraception). Osteoporosis and menopause  Osteoporosis is a disease in which the bones lose minerals and strength with aging. This can result in serious bone fractures. Your risk for osteoporosis can be identified using a bone density scan.  If you are 22 years of age or older, or if you are at risk for osteoporosis and fractures, ask your health care provider if you should be screened.  Ask your health care provider whether you should take a calcium or vitamin D supplement to lower your risk for osteoporosis.  Menopause may have certain physical symptoms and risks.  Hormone replacement therapy may reduce some of these symptoms and risks. Talk to your health care provider about whether hormone replacement therapy is right for you. Follow these instructions at home:  Schedule regular health, dental, and eye exams.  Stay current with your immunizations.  Do not use any tobacco products including cigarettes, chewing tobacco, or electronic cigarettes.  If you are pregnant, do not drink alcohol.  If you are  breastfeeding, limit how much and how often you drink alcohol.  Limit alcohol intake to no more than 1 drink per day for nonpregnant women. One drink equals 12 ounces of beer, 5 ounces of wine, or 1 ounces of hard liquor.  Do not use street drugs.  Do not share needles.  Ask your health care provider for help if you need support or information about quitting drugs.  Tell your health care provider if you often feel depressed.  Tell your health care provider if you have ever been abused or do not feel safe at home. This information is not intended to replace advice given to you by your health care provider. Make sure you discuss any questions you have with your health care provider. Document Released: 08/30/2010 Document Revised: 07/23/2015 Document Reviewed: 11/18/2014 Elsevier Interactive Patient Education  [employer].

## 2017-07-29 NOTE — Progress Notes (Unsigned)
Subjective:   Theresa Villarreal is a 67 y.o. female who presents for Medicare Annual (Subsequent) preventive examination.   Cardiac Risk Factors include: advanced age (>2men, >68 women);family history of premature cardiovascular disease;obesity (BMI >30kg/m2)     Objective:     Vitals: BP 102/68   Pulse 76   Ht 5' 8.31" (1.735 m)   Wt 242 lb 12.8 oz (110.1 kg)   SpO2 97%   BMI 36.59 kg/m   Body mass index is 36.59 kg/m.  Advanced Directives 07/29/2017 11/08/2016  Does Patient Have a Medical Advance Directive? No No  Would patient like information on creating a medical advance directive? Yes (MAU/Ambulatory/Procedural Areas - Information given) -    Tobacco Social History   Tobacco Use  Smoking Status Never Smoker  Smokeless Tobacco Never Used     Counseling given: Not Answered   Clinical Intake:     Pain : No/denies pain     Nutritional Status: BMI > 30  Obese Diabetes: No  How often do you need to have someone help you when you read instructions, pamphlets, or other written materials from your doctor or pharmacy?: 1 - Never What is the last grade level you completed in school?: 12th- Has High School Diploma  Interpreter Needed?: No     Past Medical History:  Diagnosis Date  . Known health problems: none    Past Surgical History:  Procedure Laterality Date  . ABDOMINAL HYSTERECTOMY  2006  . BREAST CYST EXCISION     right breast/benign  . TUBAL LIGATION  1998   Family History  Problem Relation Age of Onset  . Hypertension Mother   . Multiple sclerosis Mother   . Hypertension Father   . Bone cancer Maternal Grandmother    Social History   Socioeconomic History  . Marital status: Married    Spouse name: Not on file  . Number of children: Not on file  . Years of education: Not on file  . Highest education level: High school graduate  Occupational History  . Occupation: Retired  Scientific laboratory technician  . Financial resource strain: Somewhat hard  .  Food insecurity:    Worry: Never true    Inability: Never true  . Transportation needs:    Medical: No    Non-medical: No  Tobacco Use  . Smoking status: Never Smoker  . Smokeless tobacco: Never Used  Substance and Sexual Activity  . Alcohol use: No  . Drug use: No  . Sexual activity: Not Currently    Birth control/protection: Post-menopausal, Abstinence  Lifestyle  . Physical activity:    Days per week: 2 days    Minutes per session: 60 min  . Stress: Only a little  Relationships  . Social connections:    Talks on phone: More than three times a week    Gets together: More than three times a week    Attends religious service: More than 4 times per year    Active member of club or organization: No    Attends meetings of clubs or organizations: Never    Relationship status: Married  Other Topics Concern  . Not on file  Social History Narrative  . Not on file    Outpatient Encounter Medications as of 07/29/2017  Medication Sig  . ibuprofen (ADVIL,MOTRIN) 200 MG tablet Take 200 mg by mouth as needed.  . ergocalciferol (VITAMIN D2) 50000 units capsule Take 1 capsule (50,000 Units total) by mouth once a week. (Patient not taking: Reported  on 04/05/2017)  . [DISCONTINUED] benzonatate (TESSALON) 100 MG capsule Take 1-2 capsules (100-200 mg total) by mouth 3 (three) times daily as needed for cough. (Patient not taking: Reported on 07/29/2017)  . [DISCONTINUED] 0.9 %  sodium chloride infusion    No facility-administered encounter medications on file as of 07/29/2017.     Activities of Daily Living In your present state of health, do you have any difficulty performing the following activities: 07/29/2017 09/03/2016  Hearing? N N  Vision? N Y  Comment - SOMETIMES  Difficulty concentrating or making decisions? N Y  Comment - SOMETIMES  Walking or climbing stairs? N N  Dressing or bathing? N N  Doing errands, shopping? N N  Preparing Food and eating ? N -  Using the Toilet? N -  In the  past six months, have you accidently leaked urine? N -  Do you have problems with loss of bowel control? N -  Managing your Medications? N -  Managing your Finances? N -  Housekeeping or managing your Housekeeping? N -  Some recent data might be hidden    Patient Care Team: Shawnee Knapp, MD as PCP - General (Family Medicine)    Assessment:   This is a routine wellness examination for Theresa Villarreal.  Exercise Activities and Dietary recommendations Current Exercise Habits: Home exercise routine, Type of exercise: walking, Time (Minutes): 60, Frequency (Times/Week): 2, Weekly Exercise (Minutes/Week): 120, Intensity: Moderate  Goals    . Weight (lb) < 200 lb (90.7 kg) (pt-stated)     Patient would like to lose weight. She would like to lose 25 lbs. Wants to work on reducing sweets in diet, maintaining physical activity.       Fall Risk Fall Risk  07/29/2017 07/29/2017 04/05/2017 09/03/2016 01/22/2016  Falls in the past year? No No No No No   Is the patient's home free of loose throw rugs in walkways, pet beds, electrical cords, etc?   no      Grab bars in the bathroom? no      Handrails on the stairs?   yes      Adequate lighting?   yes  Timed Get Up and Go performed: Passed, up in 30 seconds or less  Depression Screen PHQ 2/9 Scores 07/29/2017 04/05/2017 09/03/2016 01/22/2016  PHQ - 2 Score 1 0 0 0     Cognitive Function     6CIT Screen 07/29/2017  What Year? 0 points  What month? 0 points  What time? 0 points  Count back from 20 0 points  Months in reverse 0 points  Repeat phrase 0 points  Total Score 0    Immunization History  Administered Date(s) Administered  . Influenza, High Dose Seasonal PF 12/28/2016  . Influenza-Unspecified 01/08/2016, 11/28/2016  . Pneumococcal-Unspecified 01/08/2016  . Tdap 05/23/2014    Qualifies for Shingles Vaccine? Yes  Screening Tests Health Maintenance  Topic Date Due  . PNA vac Low Risk Adult (2 of 2 - PCV13) 01/07/2017  . INFLUENZA VACCINE   09/28/2017  . MAMMOGRAM  02/14/2019  . COLONOSCOPY  11/09/2019  . TETANUS/TDAP  05/22/2024  . DEXA SCAN  Completed  . Hepatitis C Screening  Completed    Cancer Screenings: Lung: Low Dose CT Chest recommended if Age 68-80 years, 30 pack-year currently smoking OR have quit w/in 15years. Patient does not qualify. Breast:  Up to date on Mammogram? Yes   Up to date of Bone Density/Dexa? Yes Colorectal: Due 10/2019  Additional Screenings: Hepatitis C  Screening: Done     Plan:   Pneumonia shot today Advanced directive given to review Referral to Dietician to discuss nutrition for weight loss Advised to call insurance company to find out shingles vaccine coverage -Patient advised to keep notebook and write down when she is eating sweets and what her emotions are.  I have personally reviewed and noted the following in the patient's chart:   . Medical and social history . Use of alcohol, tobacco or illicit drugs  . Current medications and supplements . Functional ability and status . Nutritional status . Physical activity . Advanced directives . List of other physicians . Hospitalizations, surgeries, and ER visits in previous 12 months . Vitals . Screenings to include cognitive, depression, and falls . Referrals and appointments  In addition, I have reviewed and discussed with patient certain preventive protocols, quality metrics, and best practice recommendations. A written personalized care plan for preventive services as well as general preventive health recommendations were provided to patient.     Delle Reining, RN  07/29/2017

## 2017-08-06 DIAGNOSIS — Z90710 Acquired absence of both cervix and uterus: Secondary | ICD-10-CM | POA: Diagnosis not present

## 2017-08-06 DIAGNOSIS — Z008 Encounter for other general examination: Secondary | ICD-10-CM | POA: Diagnosis not present

## 2018-01-11 ENCOUNTER — Encounter: Payer: Self-pay | Admitting: Podiatry

## 2018-01-11 ENCOUNTER — Ambulatory Visit: Payer: Medicare PPO | Admitting: Podiatry

## 2018-01-11 VITALS — BP 141/84 | HR 77 | Resp 16

## 2018-01-11 DIAGNOSIS — M2041 Other hammer toe(s) (acquired), right foot: Secondary | ICD-10-CM | POA: Diagnosis not present

## 2018-01-11 DIAGNOSIS — M2042 Other hammer toe(s) (acquired), left foot: Secondary | ICD-10-CM

## 2018-01-11 DIAGNOSIS — L84 Corns and callosities: Secondary | ICD-10-CM | POA: Diagnosis not present

## 2018-01-11 DIAGNOSIS — B351 Tinea unguium: Secondary | ICD-10-CM | POA: Diagnosis not present

## 2018-01-11 DIAGNOSIS — M79675 Pain in left toe(s): Secondary | ICD-10-CM

## 2018-01-11 DIAGNOSIS — M79674 Pain in right toe(s): Secondary | ICD-10-CM

## 2018-01-11 NOTE — Progress Notes (Signed)
   Subjective:    Patient ID: Theresa Villarreal, female    DOB: [birth year], 67 y.o.   MRN: [id number]  HPI    Review of Systems  All other systems reviewed and are negative.      Objective:   Physical Exam        Assessment & Plan:

## 2018-01-16 NOTE — Progress Notes (Signed)
Subjective:   Patient ID: Theresa Villarreal, female   DOB: 67 y.o.   MRN: [id number]   HPI Patient presents with significant nail disease and ingrown toenails with thick nailbeds 1-5 both feet with abnormal position of her digits with keratotic lesion third digit distal bilateral that are painful when palpated.  She is tried to trim them herself and is not able to do it and patient does not currently smoke and likes to be active   Review of Systems  All other systems reviewed and are negative.       Objective:  Physical Exam  Constitutional: She appears well-developed and well-nourished.  Cardiovascular: Intact distal pulses.  Pulmonary/Chest: Effort normal.  Musculoskeletal: Normal range of motion.  Neurological: She is alert.  Skin: Skin is warm.  Nursing note and vitals reviewed.   Neurovascular status intact muscle strength was found to be adequate range of motion within normal limits with patient found to have significant nail disease 1-5 both feet with incurvation of the beds that are painful when pressed with distal lesions bilateral that are painful and make it hard to walk.  Patient has good digital perfusion     Assessment:  Significant mycotic nail infection 1-5 both feet with lesions bilateral third toe     Plan:  H&P conditions reviewed and debrided nailbeds 1-5 both feet with no iatrogenic bleeding and sterile sharp debridement of lesions third digit accomplished with no iatrogenic bleeding.  Reappoint for routine care of deformity

## 2018-02-23 DIAGNOSIS — Z1231 Encounter for screening mammogram for malignant neoplasm of breast: Secondary | ICD-10-CM | POA: Diagnosis not present

## 2018-02-23 LAB — HM MAMMOGRAPHY

## 2018-03-21 ENCOUNTER — Ambulatory Visit: Payer: Medicare PPO | Admitting: Podiatry

## 2018-06-27 ENCOUNTER — Telehealth (INDEPENDENT_AMBULATORY_CARE_PROVIDER_SITE_OTHER): Payer: Medicare PPO | Admitting: Family Medicine

## 2018-06-27 DIAGNOSIS — M25561 Pain in right knee: Secondary | ICD-10-CM

## 2018-06-27 MED ORDER — MELOXICAM 7.5 MG PO TABS: 7.5000 mg | ORAL_TABLET | Freq: Every day | ORAL | 0 refills | Status: DC

## 2018-06-27 NOTE — Progress Notes (Signed)
CC- knee pain and  edema- Right knee pain and swelling started 1 week ago. Knee feel like it have a real bad cramp in it and it makes it hard for me to stand. Pain is off and on and radiate down leg. Denies redness, some sore to the touch on the back side of knee. At night the pain is worse.

## 2018-06-27 NOTE — Progress Notes (Signed)
Telemedicine Encounter- SOAP NOTE Established Patient  I discussed the limitations, risks, security and privacy concerns of performing an evaluation and management service by telephone and the availability of in person appointments. I also discussed with the patient that there may be a patient responsible charge related to this service. The patient expressed understanding and agreed to proceed.  This telephone encounter was conducted with the patient's verbal consent via audio telecommunications: yes Patient was instructed to have this encounter in a suitably private space; and to only have persons present to whom they give permission to participate. In addition, patient identity was confirmed by use of name plus two identifiers (DOB and address).  I spent a total of 10 mintalking with the patient   CC- knee pain and  edema- Right knee pain and swelling started 1 week ago. Knee feel like it have a real bad cramp in it and it makes it hard for me to stand. Pain is off and on and radiate down leg. Denies redness, some sore to the touch on the back side of knee. At night the pain is worse.   Subjective   Theresa Villarreal is a 68 y.o. female established patient. Telephone visit today for right knee swelling  HPI Pt with right knee swelling over the past week described as cramping and swelling on the front and back of the right knee.  Pt states no swelling in the calf. No redness. Pt has no h/o trauma. Pt denies any excessive bending or squatting prior to onset of symptoms. Pt states she previously had difficulty with the left knee and saw on orthopedic doctor.Records reviewed-osteoarthritis. Pt a retired [employer]arry manager.  She did not return for further treatment as her husband was sick.  Pt states no other joints currently causing pain or swelling noted. Pt has not stairs in home. Pt has not elevated or used ice. Pt has not taken any medication. Pt has used bio-freeze topically with little relief  Patient  Active Problem List   Diagnosis Date Noted  . Acute pain of right knee 06/27/2018  . Osteopenia 03/08/2017  . Vitamin D deficiency 06/23/2014  . Caregiver with fatigue 06/23/2014  . Left knee pain 08/09/2013  . Primary osteoarthritis of left knee 08/09/2013    Past Medical History:  Diagnosis Date  . Known health problems: none     Current Outpatient Medications  Medication Sig Dispense Refill  . CALCIUM PO Take 1 tablet by mouth daily.    Marland Kitchen ibuprofen (ADVIL,MOTRIN) 200 MG tablet Take 200 mg by mouth as needed.    . ergocalciferol (VITAMIN D2) 50000 units capsule Take 1 capsule (50,000 Units total) by mouth once a week. (Patient not taking: Reported on 06/27/2018) 12 capsule 1   No current facility-administered medications for this visit.     No Known Allergies  Social History   Socioeconomic History  . Marital status: Married    Spouse name: Not on file  . Number of children: Not on file  . Years of education: Not on file  . Highest education level: High school graduate  Occupational History  . Occupation: Retired  Scientific laboratory technician  . Financial resource strain: Somewhat hard  . Food insecurity:    Worry: Never true    Inability: Never true  . Transportation needs:    Medical: No    Non-medical: No  Tobacco Use  . Smoking status: Never Smoker  . Smokeless tobacco: Never Used  Substance and Sexual Activity  . Alcohol  use: No  . Drug use: No  . Sexual activity: Not Currently    Birth control/protection: Post-menopausal, Abstinence  Lifestyle  . Physical activity:    Days per week: 2 days    Minutes per session: 60 min  . Stress: Only a little  Relationships  . Social connections:    Talks on phone: More than three times a week    Gets together: More than three times a week    Attends religious service: More than 4 times per year    Active member of club or organization: No    Attends meetings of clubs or organizations: Never    Relationship status: Married  .  Intimate partner violence:    Fear of current or ex partner: No    Emotionally abused: No    Physically abused: No    Forced sexual activity: No  Other Topics Concern  . Not on file  Social History Narrative  . Not on file    ROS CONSTITUTIONAL: no  fever, RESP: no SOB, no cough MS: joint pain-right knee with associated swelling-difficulty with full extension, no calf pain or tenderness INTEG:no skin color change  Objective   Vitals as reported by the patient: none Diagnoses and all orders for this visit:  Acute pain of right knee  mobic-rx-take with food, RICE treatment-avoid bending, squatting, stairs-if worsening symptoms referral to ortho for additional evaluation   I discussed the assessment and treatment plan with the patient. The patient was provided an opportunity to ask questions and all were answered. The patient agreed with the plan and demonstrated an understanding of the instructions.   The patient was advised to call back or seek an in-person evaluation if the symptoms worsen or if the condition fails to improve as anticipated.  I provided 10 minutes of non-face-to-face time during this encounter.  LISA Hannah Beat, MD  Primary Care at [facility] 06-27-18

## 2018-06-27 NOTE — Patient Instructions (Addendum)
Rest, Ice, Elevate knee mobic rx-sent to pharmacy-take daily with food Call if no improvement for ortho referral    If you have lab work done today you will be contacted with your lab results within the next 2 weeks.  If you have not heard from [place] then please contact us. The fastest way to get your results is to register for My Chart.   IF you received an x-ray today, you will receive an invoice from [facility]. Please contact [facility] at [phone] with questions or concerns regarding your invoice.   IF you received labwork today, you will receive an invoice from [facility]. Please contact [facility] at [phone] with questions or concerns regarding your invoice.   Our billing staff will not be able to assist you with questions regarding bills from these companies.  You will be contacted with the lab results as soon as they are available. The fastest way to get your results is to activate your My Chart account. Instructions are located on the last page of this paperwork. If you have not heard from [place] regarding the results in 2 weeks, please contact this office.

## 2018-07-26 ENCOUNTER — Other Ambulatory Visit: Payer: Self-pay

## 2018-07-26 ENCOUNTER — Encounter: Payer: Self-pay | Admitting: Family Medicine

## 2018-07-26 ENCOUNTER — Telehealth (INDEPENDENT_AMBULATORY_CARE_PROVIDER_SITE_OTHER): Payer: Medicare PPO | Admitting: Family Medicine

## 2018-07-26 DIAGNOSIS — M25561 Pain in right knee: Secondary | ICD-10-CM

## 2018-07-26 NOTE — Progress Notes (Signed)
Telemedicine Encounter- SOAP NOTE Established Patient  I discussed the limitations, risks, security and privacy concerns of performing an evaluation and management service by telephone and the availability of in person appointments. I also discussed with the patient that there may be a patient responsible charge related to this service. The patient expressed understanding and agreed to proceed.  This telephone encounter was conducted with the patient'sverbal consent via audio telecommunications:yes Patient was instructed to have this encounter in a suitably private space; and to only have persons present to whom they give permission to participate. In addition, patient identity was confirmed by use of name plus two identifiers (DOB and address).  I spent a total of 39min talking with the patient or their proxy. UM:PNTI pain-better after taking mobic, worse yesterday   Theresa Villarreal is a 68 y.o. female established patient. Telephone visit today for right knee pain. Pt noted the end of April-no injury.  Pt states knee pain improved with mobic and she has been taking daily since given. Pt states she took her mom to the grocery 2 days ago and knee pain worse yesterday with swelling posterior. Pt has not completed xray or exam due to COVID. No h/o of surgery   Patient Active Problem List   Diagnosis Date Noted  . Acute pain of right knee 06/27/2018  . Osteopenia 03/08/2017  . Vitamin D deficiency 06/23/2014  . Caregiver with fatigue 06/23/2014  . Left knee pain 08/09/2013  . Primary osteoarthritis of left knee 08/09/2013    Past Medical History:  Diagnosis Date  . Known health problems: none     Current Outpatient Medications  Medication Sig Dispense Refill  . CALCIUM PO Take 1 tablet by mouth daily.    . meloxicam (MOBIC) 7.5 MG tablet Take 1 tablet (7.5 mg total) by mouth daily. 30 tablet 0  . ergocalciferol (VITAMIN D2) 50000 units capsule Take 1 capsule (50,000 Units total) by  mouth once a week. (Patient not taking: Reported on 06/27/2018) 12 capsule 1   No current facility-administered medications for this visit.     No Known Allergies  Social History   Socioeconomic History  . Marital status: Married    Spouse name: Not on file  . Number of children: Not on file  . Years of education: Not on file  . Highest education level: High school graduate  Occupational History  . Occupation: Retired  Scientific laboratory technician  . Financial resource strain: Somewhat hard  . Food insecurity:    Worry: Never true    Inability: Never true  . Transportation needs:    Medical: No    Non-medical: No  Tobacco Use  . Smoking status: Never Smoker  . Smokeless tobacco: Never Used  Substance and Sexual Activity  . Alcohol use: No  . Drug use: No  . Sexual activity: Not Currently    Birth control/protection: Post-menopausal, Abstinence  Lifestyle  . Physical activity:    Days per week: 2 days    Minutes per session: 60 min  . Stress: Only a little  Relationships  . Social connections:    Talks on phone: More than three times a week    Gets together: More than three times a week    Attends religious service: More than 4 times per year    Active member of club or organization: No    Attends meetings of clubs or organizations: Never    Relationship status: Married  . Intimate partner violence:  Fear of current or ex partner: No    Emotionally abused: No    Physically abused: No    Forced sexual activity: No  Other Topics Concern  . Not on file  Social History Narrative  . Not on file    Review of Systems  Musculoskeletal: Positive for joint pain. Negative for falls.    Objective   Vitals as reported by the patient: None-virtual  1. Acute pain of right knee mobic-rx-d/w pt take BID today and tomorrow with food, elevation, ice-refer for eval/possible xray-some improvement with mobic and rest with reoccurrence noted with extended walking - Ambulatory referral to  Orthopedic Surgery I discussed the assessment and treatment plan with the patient. The patient was provided an opportunity to ask questions and all were answered. The patient agreed with the plan and demonstrated an understanding of the instructions.   The patient was advised to call back or seek an in-person evaluation if the symptoms worsen or if the condition fails to improve as anticipated.  I provided 16minutes of non-face-to-face time during this encounter.  LISA Hannah Beat, MD  Primary Care at [facility]  07-26-18

## 2018-07-26 NOTE — Progress Notes (Signed)
Right knee pain. Started last week of April and felt worse yesterday.

## 2018-08-01 ENCOUNTER — Other Ambulatory Visit: Payer: Self-pay | Admitting: Family Medicine

## 2018-08-01 DIAGNOSIS — M25561 Pain in right knee: Secondary | ICD-10-CM

## 2018-08-01 MED ORDER — MELOXICAM 7.5 MG PO TABS: 7.5000 mg | ORAL_TABLET | Freq: Every day | ORAL | 0 refills | Status: DC

## 2018-08-01 NOTE — Telephone Encounter (Signed)
Copied from [place] [id number]. Topic: Quick Communication - Rx Refill/Question >> Aug 01, 2018  1:38 PM Alanda Slim E wrote: Medication: meloxicam (MOBIC) 7.5 MG tablet  Has the patient contacted their pharmacy? Yes - request sent with no response   Preferred Pharmacy (with phone number or street name): [facility], [place] [phone] (Phone) [phone] (Fax)    Agent: Please be advised that RX refills may take up to 3 business days. We ask that you follow-up with your pharmacy.

## 2018-08-01 NOTE — Telephone Encounter (Signed)
Pt is requesting mobic medication, last fill last month

## 2018-08-09 ENCOUNTER — Encounter: Payer: Self-pay | Admitting: Orthopaedic Surgery

## 2018-08-09 ENCOUNTER — Ambulatory Visit (INDEPENDENT_AMBULATORY_CARE_PROVIDER_SITE_OTHER): Payer: Medicare PPO

## 2018-08-09 ENCOUNTER — Other Ambulatory Visit: Payer: Self-pay

## 2018-08-09 ENCOUNTER — Ambulatory Visit (INDEPENDENT_AMBULATORY_CARE_PROVIDER_SITE_OTHER): Payer: Medicare PPO | Admitting: Orthopaedic Surgery

## 2018-08-09 DIAGNOSIS — M25561 Pain in right knee: Secondary | ICD-10-CM | POA: Diagnosis not present

## 2018-08-09 DIAGNOSIS — G8929 Other chronic pain: Secondary | ICD-10-CM

## 2018-08-09 MED ORDER — LIDOCAINE HCL 1 % IJ SOLN
2.0000 mL | INTRAMUSCULAR | Status: AC | PRN
  Administered 2018-08-09: 2 mL

## 2018-08-09 MED ORDER — METHYLPREDNISOLONE ACETATE 40 MG/ML IJ SUSP
40.0000 mg | INTRAMUSCULAR | Status: AC | PRN
  Administered 2018-08-09: 40 mg via INTRA_ARTICULAR

## 2018-08-09 MED ORDER — BUPIVACAINE HCL 0.5 % IJ SOLN
2.0000 mL | INTRAMUSCULAR | Status: AC | PRN
  Administered 2018-08-09: 2 mL via INTRA_ARTICULAR

## 2018-08-09 NOTE — Progress Notes (Signed)
Office Visit Note   Patient: Theresa Villarreal           Date of Birth: 13-Apr-1950           MRN: [id number] Visit Date: 08/09/2018              Requested by: Maryruth Hancock, MD [place],  [place] [address] PCP: Shawnee Knapp, MD   Assessment & Plan: Visit Diagnoses:  1. Chronic pain of right knee     Plan: Impression is right knee osteoarthritis with possible degenerative medial meniscal tear.  Today we injected her knee with cortisone.  She will pay attention to how this affects the pain over the next couple weeks.  If she does not get any relief then she will call us so that we can obtain an MRI.  Hopefully the injection will help her knee feel much better.  Questions encouraged and answered.  Follow-Up Instructions: Return if symptoms worsen or fail to improve.   Orders:  Orders Placed This Encounter  Procedures  . XR KNEE 3 VIEW RIGHT   No orders of the defined types were placed in this encounter.     Procedures: Large Joint Inj: R knee on 08/09/2018 12:02 PM Indications: pain Details: 22 G needle  Arthrogram: No  Medications: 40 mg methylPREDNISolone acetate 40 MG/ML; 2 mL lidocaine 1 %; 2 mL bupivacaine 0.5 % Consent was given by the patient. Patient was prepped and draped in the usual sterile fashion.       Clinical Data: No additional findings.   Subjective: Chief Complaint  Patient presents with  . Right Knee - Pain    Theresa Villarreal is a very pleasant 69 year old female comes in for evaluation of right knee pain for the last month.  She states that the her right knee hurts to walk and then has a little bit of swelling.  She also feels some radiating pain into the back of the right knee.  She takes meloxicam which helps.  She feels that this is overall been hurting for about 4 to 5 years ago when she may have possibly hurt it.  She is reporting some mild mechanical symptoms.  Denies any numbness and tingling.   Review of Systems  Constitutional:  Negative.   HENT: Negative.   Eyes: Negative.   Respiratory: Negative.   Cardiovascular: Negative.   Endocrine: Negative.   Musculoskeletal: Negative.   Neurological: Negative.   Hematological: Negative.   Psychiatric/Behavioral: Negative.   All other systems reviewed and are negative.    Objective: Vital Signs: There were no vitals taken for this visit.  Physical Exam Vitals signs and nursing note reviewed.  Constitutional:      Appearance: She is well-developed.  HENT:     Head: Normocephalic and atraumatic.  Neck:     Musculoskeletal: Neck supple.  Pulmonary:     Effort: Pulmonary effort is normal.  Abdominal:     Palpations: Abdomen is soft.  Skin:    General: Skin is warm.     Capillary Refill: Capillary refill takes less than 2 seconds.  Neurological:     Mental Status: She is alert and oriented to person, place, and time.  Psychiatric:        Behavior: Behavior normal.        Thought Content: Thought content normal.        Judgment: Judgment normal.     Ortho Exam Right knee exam shows no joint effusion.  Collaterals and  cruciates are stable.  No significant joint line tenderness. Specialty Comments:  No specialty comments available.  Imaging: Xr Knee 3 View Right  Result Date: 08/09/2018 Moderate right knee osteoarthritis    PMFS History: Patient Active Problem List   Diagnosis Date Noted  . Acute pain of right knee 06/27/2018  . Osteopenia 03/08/2017  . Vitamin D deficiency 06/23/2014  . Caregiver with fatigue 06/23/2014  . Left knee pain 08/09/2013  . Primary osteoarthritis of left knee 08/09/2013   Past Medical History:  Diagnosis Date  . Known health problems: none     Family History  Problem Relation Age of Onset  . Hypertension Mother   . Multiple sclerosis Mother   . Hypertension Father   . Bone cancer Maternal Grandmother     Past Surgical History:  Procedure Laterality Date  . ABDOMINAL HYSTERECTOMY  2006  . BREAST CYST  EXCISION     right breast/benign  . TUBAL LIGATION  1998   Social History   Occupational History  . Occupation: Retired  Tobacco Use  . Smoking status: Never Smoker  . Smokeless tobacco: Never Used  Substance and Sexual Activity  . Alcohol use: No  . Drug use: No  . Sexual activity: Not Currently    Birth control/protection: Post-menopausal, Abstinence

## 2018-09-26 ENCOUNTER — Other Ambulatory Visit: Payer: Self-pay

## 2018-09-26 DIAGNOSIS — R6889 Other general symptoms and signs: Secondary | ICD-10-CM | POA: Diagnosis not present

## 2018-09-26 DIAGNOSIS — Z20822 Contact with and (suspected) exposure to covid-19: Secondary | ICD-10-CM

## 2018-09-28 LAB — NOVEL CORONAVIRUS, NAA: SARS-CoV-2, NAA: NOT DETECTED

## 2018-10-03 ENCOUNTER — Telehealth: Payer: Self-pay

## 2018-10-03 NOTE — Telephone Encounter (Signed)
Pt. Called back, given COVID 19 results.

## 2019-01-23 DIAGNOSIS — H2513 Age-related nuclear cataract, bilateral: Secondary | ICD-10-CM | POA: Diagnosis not present

## 2019-01-23 DIAGNOSIS — H40033 Anatomical narrow angle, bilateral: Secondary | ICD-10-CM | POA: Diagnosis not present

## 2019-03-13 DIAGNOSIS — Z1231 Encounter for screening mammogram for malignant neoplasm of breast: Secondary | ICD-10-CM | POA: Diagnosis not present

## 2019-06-11 DIAGNOSIS — Z6835 Body mass index (BMI) 35.0-35.9, adult: Secondary | ICD-10-CM | POA: Diagnosis not present

## 2019-06-11 DIAGNOSIS — H269 Unspecified cataract: Secondary | ICD-10-CM | POA: Diagnosis not present

## 2019-06-11 DIAGNOSIS — Z90710 Acquired absence of both cervix and uterus: Secondary | ICD-10-CM | POA: Diagnosis not present

## 2019-06-11 DIAGNOSIS — E559 Vitamin D deficiency, unspecified: Secondary | ICD-10-CM | POA: Diagnosis not present

## 2019-06-11 DIAGNOSIS — H538 Other visual disturbances: Secondary | ICD-10-CM | POA: Diagnosis not present

## 2019-06-11 DIAGNOSIS — E669 Obesity, unspecified: Secondary | ICD-10-CM | POA: Diagnosis not present

## 2019-08-05 DIAGNOSIS — H25813 Combined forms of age-related cataract, bilateral: Secondary | ICD-10-CM | POA: Diagnosis not present

## 2019-08-05 DIAGNOSIS — H401234 Low-tension glaucoma, bilateral, indeterminate stage: Secondary | ICD-10-CM | POA: Diagnosis not present

## 2019-08-19 DIAGNOSIS — H401234 Low-tension glaucoma, bilateral, indeterminate stage: Secondary | ICD-10-CM | POA: Diagnosis not present

## 2019-09-06 DIAGNOSIS — H401211 Low-tension glaucoma, right eye, mild stage: Secondary | ICD-10-CM | POA: Diagnosis not present

## 2019-09-06 DIAGNOSIS — H401223 Low-tension glaucoma, left eye, severe stage: Secondary | ICD-10-CM | POA: Diagnosis not present

## 2019-10-28 DIAGNOSIS — H401211 Low-tension glaucoma, right eye, mild stage: Secondary | ICD-10-CM | POA: Diagnosis not present

## 2019-10-28 DIAGNOSIS — H401223 Low-tension glaucoma, left eye, severe stage: Secondary | ICD-10-CM | POA: Diagnosis not present

## 2019-11-03 ENCOUNTER — Other Ambulatory Visit: Payer: Self-pay

## 2019-11-03 ENCOUNTER — Emergency Department (HOSPITAL_COMMUNITY): Payer: Medicare PPO

## 2019-11-03 ENCOUNTER — Emergency Department (HOSPITAL_COMMUNITY)
Admission: EM | Admit: 2019-11-03 | Discharge: 2019-11-04 | Disposition: A | Discharge status: Home / Self Care | Payer: Medicare PPO | Attending: Emergency Medicine | Admitting: Emergency Medicine

## 2019-11-03 ENCOUNTER — Encounter (HOSPITAL_COMMUNITY): Payer: Self-pay | Admitting: Emergency Medicine

## 2019-11-03 DIAGNOSIS — W19XXXA Unspecified fall, initial encounter: Secondary | ICD-10-CM

## 2019-11-03 DIAGNOSIS — S52124D Nondisplaced fracture of head of right radius, subsequent encounter for closed fracture with routine healing: Secondary | ICD-10-CM | POA: Diagnosis not present

## 2019-11-03 DIAGNOSIS — S59901A Unspecified injury of right elbow, initial encounter: Secondary | ICD-10-CM | POA: Diagnosis present

## 2019-11-03 DIAGNOSIS — S53104A Unspecified dislocation of right ulnohumeral joint, initial encounter: Secondary | ICD-10-CM | POA: Diagnosis not present

## 2019-11-03 DIAGNOSIS — Y999 Unspecified external cause status: Secondary | ICD-10-CM | POA: Insufficient documentation

## 2019-11-03 DIAGNOSIS — Y929 Unspecified place or not applicable: Secondary | ICD-10-CM | POA: Diagnosis not present

## 2019-11-03 DIAGNOSIS — S42401A Unspecified fracture of lower end of right humerus, initial encounter for closed fracture: Secondary | ICD-10-CM | POA: Diagnosis not present

## 2019-11-03 DIAGNOSIS — S52121A Displaced fracture of head of right radius, initial encounter for closed fracture: Secondary | ICD-10-CM | POA: Diagnosis not present

## 2019-11-03 DIAGNOSIS — S53121A Posterior subluxation of right ulnohumeral joint, initial encounter: Secondary | ICD-10-CM | POA: Diagnosis not present

## 2019-11-03 DIAGNOSIS — Y939 Activity, unspecified: Secondary | ICD-10-CM | POA: Diagnosis not present

## 2019-11-03 DIAGNOSIS — W1839XA Other fall on same level, initial encounter: Secondary | ICD-10-CM | POA: Insufficient documentation

## 2019-11-03 DIAGNOSIS — S53106A Unspecified dislocation of unspecified ulnohumeral joint, initial encounter: Secondary | ICD-10-CM

## 2019-11-03 DIAGNOSIS — S42431A Displaced fracture (avulsion) of lateral epicondyle of right humerus, initial encounter for closed fracture: Secondary | ICD-10-CM | POA: Diagnosis not present

## 2019-11-03 DIAGNOSIS — S52041D Displaced fracture of coronoid process of right ulna, subsequent encounter for closed fracture with routine healing: Secondary | ICD-10-CM | POA: Diagnosis not present

## 2019-11-03 DIAGNOSIS — S52131A Displaced fracture of neck of right radius, initial encounter for closed fracture: Secondary | ICD-10-CM | POA: Diagnosis not present

## 2019-11-03 DIAGNOSIS — Z4789 Encounter for other orthopedic aftercare: Secondary | ICD-10-CM

## 2019-11-03 DIAGNOSIS — S52041A Displaced fracture of coronoid process of right ulna, initial encounter for closed fracture: Secondary | ICD-10-CM | POA: Diagnosis not present

## 2019-11-03 NOTE — ED Triage Notes (Signed)
Pt c/o right elbow pain after a fall tonight.

## 2019-11-03 NOTE — ED Notes (Signed)
Pt called for triage, no response. 

## 2019-11-03 NOTE — ED Triage Notes (Signed)
Emergency Medicine Provider Triage Evaluation Note  Theresa Villarreal , a 69 y.o. female  was evaluated in triage.  Pt complains of this patient is a pleasant 69 year old female who accidentally fell when she was walking her uneven driveway, she landed on her right arm in a hyperflexed position at the elbow.  This caused the onset of pain in the arm.  She was able to get up and walk away but has constant pain in the right elbow.  She is right-hand dominant..  Review of Systems  Positive: Pain in the elbow, abrasion Negative: Numbness or weakness  Physical Exam  There were no vitals taken for this visit. Gen:   Awake, no distress   HEENT:  Atraumatic  Resp:  Normal effort  Cardiac:  Normal rate   MSK:   Moves extremities without difficulty except for the right elbow which she cannot fully extend or flex, difficulty with pronation and supination secondary to pain, pain over the radial head, no pain over the olecranon process Neuro:  Speech clear, she can grip, normal sensation to hands  Medical Decision Making  Medically screening exam initiated at 8:36 PM.  Appropriate orders placed.  CHARESE ABUNDIS was informed that the remainder of the evaluation will be completed by another provider, this initial triage assessment does not replace that evaluation, and the importance of remaining in the ED until their evaluation is complete.  Clinical Impression  No head trauma, neck trauma, local injury to the right elbow, imaging work-up: Up-to-date on tetanus   Noemi Chapel, MD 11/03/19 2037

## 2019-11-03 NOTE — ED Notes (Signed)
Pt called for triage x2. No response.

## 2019-11-04 ENCOUNTER — Emergency Department (HOSPITAL_COMMUNITY): Payer: Medicare PPO

## 2019-11-04 DIAGNOSIS — S52041D Displaced fracture of coronoid process of right ulna, subsequent encounter for closed fracture with routine healing: Secondary | ICD-10-CM | POA: Diagnosis not present

## 2019-11-04 DIAGNOSIS — S52121A Displaced fracture of head of right radius, initial encounter for closed fracture: Secondary | ICD-10-CM | POA: Diagnosis not present

## 2019-11-04 DIAGNOSIS — S53104A Unspecified dislocation of right ulnohumeral joint, initial encounter: Secondary | ICD-10-CM | POA: Diagnosis not present

## 2019-11-04 DIAGNOSIS — S52041A Displaced fracture of coronoid process of right ulna, initial encounter for closed fracture: Secondary | ICD-10-CM | POA: Diagnosis not present

## 2019-11-04 DIAGNOSIS — S52124D Nondisplaced fracture of head of right radius, subsequent encounter for closed fracture with routine healing: Secondary | ICD-10-CM | POA: Diagnosis not present

## 2019-11-04 MED ORDER — HYDROCODONE-ACETAMINOPHEN 5-325 MG PO TABS
2.0000 | ORAL_TABLET | Freq: Once | ORAL | Status: AC
  Administered 2019-11-04: 2 via ORAL
  Filled 2019-11-04: qty 2

## 2019-11-04 MED ORDER — ACETAMINOPHEN 500 MG PO TABS: 1000.0000 mg | ORAL_TABLET | Freq: Three times a day (TID) | ORAL | 0 refills | Status: AC

## 2019-11-04 MED ORDER — HYDROCODONE-ACETAMINOPHEN 5-325 MG PO TABS: 1.0000 | ORAL_TABLET | Freq: Three times a day (TID) | ORAL | 0 refills | Status: AC | PRN

## 2019-11-04 NOTE — Discharge Instructions (Signed)
For pain control you may take at 1000 mg of Tylenol every 8 hours scheduled.  In addition you can take 0.5 to 1 tablet of Norco/Vicodin every 8 hours for pain not controlled with the scheduled Tylenol.  

## 2019-11-04 NOTE — ED Provider Notes (Signed)
[facility] EMERGENCY DEPARTMENT Provider Note  CSN: [id number] Arrival date & time: 11/03/19 1931  Chief Complaint(s) Elbow Pain  HPI Theresa Villarreal is a 69 y.o. female   The history is provided by the patient.  Arm Injury Location:  Elbow Elbow location:  R elbow Injury: yes   Mechanism of injury: fall   Fall:    Fall occurred:  Walking   Point of impact: right arm tucked in. Pain details:    Quality:  Throbbing and aching   Radiates to:  Does not radiate   Severity:  Severe   Onset quality:  Sudden   Duration:  5 hours   Timing:  Constant   Progression:  Waxing and waning Relieved by:  Immobilization Worsened by:  Movement Associated symptoms: decreased range of motion and swelling   Associated symptoms: no fever, no numbness and no tingling     Past Medical History Past Medical History:  Diagnosis Date  . Known health problems: none    Patient Active Problem List   Diagnosis Date Noted  . Acute pain of right knee 06/27/2018  . Osteopenia 03/08/2017  . Vitamin D deficiency 06/23/2014  . Caregiver with fatigue 06/23/2014  . Left knee pain 08/09/2013  . Primary osteoarthritis of left knee 08/09/2013   Home Medication(s) Prior to Admission medications   Medication Sig Start Date End Date Taking? Authorizing Provider  acetaminophen (TYLENOL) 500 MG tablet Take 2 tablets (1,000 mg total) by mouth every 8 (eight) hours for 5 days. Do not take more than 4000 mg of acetaminophen (Tylenol) in a 24-hour period. Please note that other medicines that you may be prescribed may have Tylenol as well. 11/04/19 11/09/19  Fatima Blank, MD  CALCIUM PO Take 1 tablet by mouth daily.    [provider]  ergocalciferol (VITAMIN D2) 50000 units capsule Take 1 capsule (50,000 Units total) by mouth once a week. Patient not taking: Reported on 06/27/2018 09/03/16   Shawnee Knapp, MD  HYDROcodone-acetaminophen (NORCO/VICODIN) 5-325 MG tablet Take 1 tablet by  mouth every 8 (eight) hours as needed for up to 5 days for severe pain (That is not improved by your scheduled acetaminophen regimen). Please do not exceed 4000 mg of acetaminophen (Tylenol) a 24-hour period. Please note that he may be prescribed additional medicine that contains acetaminophen. 11/04/19 11/09/19  Fatima Blank, MD  meloxicam (MOBIC) 7.5 MG tablet Take 1 tablet (7.5 mg total) by mouth daily. 08/01/18   Rutherford Guys, MD                                                                                                                                    Past Surgical History Past Surgical History:  Procedure Laterality Date  . ABDOMINAL HYSTERECTOMY  2006  . BREAST CYST EXCISION     right breast/benign  . TUBAL LIGATION  1998   Family History  Family History  Problem Relation Age of Onset  . Hypertension Mother   . Multiple sclerosis Mother   . Hypertension Father   . Bone cancer Maternal Grandmother     Social History Social History   Tobacco Use  . Smoking status: Never Smoker  . Smokeless tobacco: Never Used  Vaping Use  . Vaping Use: Never used  Substance Use Topics  . Alcohol use: No  . Drug use: No   Allergies Patient has no known allergies.  Review of Systems Review of Systems  Constitutional: Negative for fever.   All other systems are reviewed and are negative for acute change except as noted in the HPI  Physical Exam Vital Signs  I have reviewed the triage vital signs BP 106/63 (BP Location: Left Arm)   Pulse 74   Temp 98 F (36.7 C) (Oral)   Resp 18   Ht 5\' 9"  (1.753 m)   Wt 111.1 kg   SpO2 98%   BMI 36.18 kg/m   Physical Exam Vitals reviewed.  Constitutional:      General: She is not in acute distress.    Appearance: She is well-developed. She is not diaphoretic.  HENT:     Head: Normocephalic and atraumatic.     Right Ear: External ear normal.     Left Ear: External ear normal.     Nose: Nose normal.  Eyes:     General:  No scleral icterus.       Right eye: No discharge.        Left eye: No discharge.     Conjunctiva/sclera: Conjunctivae normal.     Pupils: Pupils are equal, round, and reactive to light.  Neck:     Trachea: Phonation normal.  Cardiovascular:     Rate and Rhythm: Normal rate and regular rhythm.     Pulses:          Radial pulses are 2+ on the right side and 2+ on the left side.       Dorsalis pedis pulses are 2+ on the right side and 2+ on the left side.     Heart sounds: Normal heart sounds. No murmur heard.  No friction rub. No gallop.   Pulmonary:     Effort: Pulmonary effort is normal. No respiratory distress.     Breath sounds: Normal breath sounds. No stridor. No wheezing.  Abdominal:     General: There is no distension.     Palpations: Abdomen is soft.     Tenderness: There is no abdominal tenderness.  Musculoskeletal:     Right elbow: Swelling and deformity present. Decreased range of motion. Tenderness present.     Right hand: Normal strength. Normal sensation. Normal capillary refill. Normal pulse.     Cervical back: Normal range of motion and neck supple. No bony tenderness.     Thoracic back: No bony tenderness.     Lumbar back: No bony tenderness.     Comments: Clavicles stable. Chest stable to AP/Lat compression. Pelvis stable to Lat compression. No chest or abdominal wall contusion.  Skin:    General: Skin is warm and dry.     Findings: No erythema or rash.  Neurological:     Mental Status: She is alert and oriented to person, place, and time.     Comments: Moving all extremities  Psychiatric:        Behavior: Behavior normal.     ED Results and Treatments Labs (all labs ordered are listed, but  only abnormal results are displayed) Labs Reviewed - No data to display                                                                                                                       EKG  EKG Interpretation  Date/Time:    Ventricular Rate:    PR  Interval:    QRS Duration:   QT Interval:    QTC Calculation:   R Axis:     Text Interpretation:        Radiology DG Elbow 2 Views Right  Result Date: 11/04/2019 CLINICAL DATA:  Postreduction EXAM: RIGHT ELBOW - 2 VIEW COMPARISON:  11/04/2019 FINDINGS: Single lateral view of the right elbow with splint placed. There has been interval reduction of previous posterior dislocation of the elbow. The joint appears to be in anatomic location now. Displaced fracture fragments from the radial head and ulnar coronoid process or again demonstrated. IMPRESSION: Interval reduction of previous right elbow dislocation. Displaced fracture fragments from the radial head and ulnar coronoid process again demonstrated. Electronically Signed   By: Lucienne Capers M.D.   On: 11/04/2019 02:53   DG ELBOW COMPLETE RIGHT (3+VIEW)  Result Date: 11/04/2019 CLINICAL DATA:  Post splint.  Limited mobility. EXAM: RIGHT ELBOW - COMPLETE 3+ VIEW COMPARISON:  11/03/2019 FINDINGS: Fracture dislocation of the right elbow again demonstrated. There is persistent complete posterior dislocation of the radius and ulna with respect to the distal humerus. Displaced fracture fragments off of the radial head and ulnar coronoid process are demonstrated. IMPRESSION: Fracture dislocation of the right elbow with persistent posterior dislocation. Electronically Signed   By: Lucienne Capers M.D.   On: 11/04/2019 01:49   DG ELBOW COMPLETE RIGHT (3+VIEW)  Result Date: 11/03/2019 CLINICAL DATA:  Trauma, fall.  Fall while walking in her driveway. EXAM: RIGHT ELBOW - COMPLETE 3+ VIEW COMPARISON:  None. FINDINGS: There is a displaced fracture involving the lateral aspect of the radial head and neck. An 18 mm fracture fragment is displaced and flipped located next to the lateral humeral condyle. Displaced fracture fragment in the volar elbow, likely from the coronoid also seen. Mild elbow joint subluxation with the distal humerus subluxed volarly.  Moderate joint effusion. Tiny osseous density posteriorly to the distal humerus with donor site uncertain. IMPRESSION: 1. Displaced fracture involving the lateral aspect of the radial head and neck. An 18 mm fracture fragment is displaced and flipped. 2. Displaced small fracture fragment in the volar elbow, likely from the coronoid. 3. Mild elbow joint subluxation. Electronically Signed   By: Keith Rake M.D.   On: 11/03/2019 21:02    Pertinent labs & imaging results that were available during my care of the patient were reviewed by me and considered in my medical decision making (see chart for details).  Medications Ordered in ED Medications  HYDROcodone-acetaminophen (NORCO/VICODIN) 5-325 MG per tablet 2 tablet (2 tablets Oral Given 11/04/19 0108)  Procedures .Ortho Injury Treatment  Date/Time: 11/04/2019 4:04 AM Performed by: Fatima Blank, MD Authorized by: Fatima Blank, MD   Consent:    Consent obtained:  Verbal   Consent given by:  Patient   Risks discussed:  Irreducible dislocation, recurrent dislocation, restricted joint movement, vascular damage, stiffness, fracture and nerve damage   Alternatives discussed:  ImmobilizationInjury location: elbow Location details: right elbow Injury type: fracture-dislocation Pre-procedure neurovascular assessment: neurovascularly intact  Anesthesia: Local anesthesia used: no  Patient sedated: NoManipulation performed: yes Skeletal traction used: yes Reduction successful: yes X-ray confirmed reduction: yes Immobilization: splint Splint type: long arm and double sugar tong Supplies used: elastic bandage Post-procedure neurovascular assessment: post-procedure neurovascularly intact     (including critical care time)  Medical Decision Making / ED Course I have reviewed the nursing notes  for this encounter and the patient's prior records (if available in EHR or on provided paperwork).   LOREE SHEHATA was evaluated in Emergency Department on 11/04/2019 for the symptoms described in the history of present illness. She was evaluated in the context of the global COVID-19 pandemic, which necessitated consideration that the patient might be at risk for infection with the SARS-CoV-2 virus that causes COVID-19. Institutional protocols and algorithms that pertain to the evaluation of patients at risk for COVID-19 are in a state of rapid change based on information released by regulatory bodies including the CDC and federal and state organizations. These policies and algorithms were followed during the patient's care in the ED.  Right elbow fracture/dislocation seen on imaging.  NVI distally No other injuries noted. PO norco provided. Splint applied, but post splint xray showed worsened dislocation. I reduced it and resplinted. Sling provided. Ortho f/u needed.     Final Clinical Impression(s) / ED Diagnoses Final diagnoses:  Fall, initial encounter  Closed fracture dislocation of right elbow, initial encounter  Closed dislocation of right elbow, initial encounter  Elbow dislocation   The patient appears reasonably screened and/or stabilized for discharge and I doubt any other medical condition or other [facility] requiring further screening, evaluation, or treatment in the ED at this time prior to discharge. Safe for discharge with strict return precautions.  Disposition: Discharge  Condition: Good  I have discussed the results, Dx and Tx plan with the patient/family who expressed understanding and agree(s) with the plan. Discharge instructions discussed at length. The patient/family was given strict return precautions who verbalized understanding of the instructions. No further questions at time of discharge.    ED Discharge Orders         Ordered    acetaminophen (TYLENOL) 500 MG  tablet  Every 8 hours        11/04/19 0209    HYDROcodone-acetaminophen (NORCO/VICODIN) 5-325 MG tablet  Every 8 hours PRN        11/04/19 0209           Follow Up: Shawnee Knapp, MD [place] [address] [phone]  Schedule an appointment as soon as possible for a visit  As needed  Haddix, Thomasene Lot, MD [place] [address] [phone]  Schedule an appointment as soon as possible for a visit  For close follow up to assess for elbow fracture/dislocation      This chart was dictated using voice recognition software.  Despite best efforts to proofread,  errors can occur which can change the documentation meaning.   Fatima Blank, MD 11/04/19 [phone]

## 2019-11-04 NOTE — Progress Notes (Signed)
Orthopedic Tech Progress Note Patient Details:  Theresa Villarreal 1950/04/25 [id number]  Ortho Devices Type of Ortho Device: Arm sling, Sugartong splint Ortho Device/Splint Location: rue Ortho Device/Splint Interventions: Ordered, Application, Adjustment   Post Interventions Patient Tolerated: Fair Instructions Provided: Care of device, Adjustment of device   [name] 11/04/2019, 1:57 AM

## 2019-11-11 ENCOUNTER — Ambulatory Visit: Payer: Self-pay | Admitting: Student

## 2019-11-11 DIAGNOSIS — S42401A Unspecified fracture of lower end of right humerus, initial encounter for closed fracture: Secondary | ICD-10-CM

## 2019-11-11 DIAGNOSIS — S53104A Unspecified dislocation of right ulnohumeral joint, initial encounter: Secondary | ICD-10-CM | POA: Insufficient documentation

## 2019-11-12 NOTE — Progress Notes (Signed)
[place], [place] [address] Phone: [phone] Fax: [phone]      Your procedure is scheduled on 11/15/19.  Report to [facility] Main Entrance "A" at 7:00 A.M., and check in at the Admitting office.  Call this number if you have problems the morning of surgery:  [phone]  Call [phone] if you have any questions prior to your surgery date Monday-Friday 8am-4pm    Remember:  Do not eat after midnight the night before your surgery  You may drink clear liquids until 6:00 the morning of your surgery.   Clear liquids allowed are: Water, Non-Citrus Juices (without pulp), Carbonated Beverages, Clear Tea, Black Coffee Only, and Gatorade  Please complete your PRE-SURGERY ENSURE that was provided to you by 6:00 the morning of surgery.  Please, if able, drink it in one setting. DO NOT SIP.    Take these medicines the morning of surgery with A SIP OF WATER: HYDROcodone-acetaminophen (NORCO/VICODIN) - as needed  As of today, STOP taking any Aspirin (unless otherwise instructed by your surgeon) Aleve, Naproxen, Ibuprofen, Motrin, Advil, Goody's, BC's, all herbal medications, fish oil, and all vitamins.                      Do not wear jewelry, make up, or nail polish            Do not wear lotions, powders, perfumes or deodorant.            Do not shave 48 hours prior to surgery.              Do not bring valuables to the hospital.            [facility] is not responsible for any belongings or valuables.  Do NOT Smoke (Tobacco/Vaping) or drink Alcohol 24 hours prior to your procedure If you use a CPAP at night, you may bring all equipment for your overnight stay.   Contacts, glasses, dentures or bridgework may not be worn into surgery.      For patients admitted to the hospital, discharge time will be determined by your treatment team.   Patients discharged the day of surgery will not  be allowed to drive home, and someone needs to stay with them for 24 hours.    Special instructions:   [place]- Preparing For Surgery  Before surgery, you can play an important role. Because skin is not sterile, your skin needs to be as free of germs as possible. You can reduce the number of germs on your skin by washing with CHG (chlorahexidine gluconate) Soap before surgery.  CHG is an antiseptic cleaner which kills germs and bonds with the skin to continue killing germs even after washing.    Oral Hygiene is also important to reduce your risk of infection.  Remember - BRUSH YOUR TEETH THE MORNING OF SURGERY WITH YOUR REGULAR TOOTHPASTE  Please do not use if you have an allergy to CHG or antibacterial soaps. If your skin becomes reddened/irritated stop using the CHG.  Do not shave (including legs and underarms) for at least 48 hours prior to first CHG shower. It is OK to shave your face.  Please follow these instructions carefully.   1. Shower the NIGHT BEFORE SURGERY and the MORNING OF SURGERY with CHG Soap.   2. If you chose to wash your hair, wash your hair first as usual with your normal  shampoo.  3. After you shampoo, rinse your hair and body thoroughly to remove the shampoo.  4. Use CHG as you would any other liquid soap. You can apply CHG directly to the skin and wash gently with a scrungie or a clean washcloth.   5. Apply the CHG Soap to your body ONLY FROM THE NECK DOWN.  Do not use on open wounds or open sores. Avoid contact with your eyes, ears, mouth and genitals (private parts). Wash Face and genitals (private parts)  with your normal soap.   6. Wash thoroughly, paying special attention to the area where your surgery will be performed.  7. Thoroughly rinse your body with warm water from the neck down.  8. DO NOT shower/wash with your normal soap after using and rinsing off the CHG Soap.  9. Pat yourself dry with a CLEAN TOWEL.  10. Wear CLEAN PAJAMAS to bed the  night before surgery  11. Place CLEAN SHEETS on your bed the night of your first shower and DO NOT SLEEP WITH PETS.   Day of Surgery: Wear Clean/Comfortable clothing the morning of surgery Do not apply any deodorants/lotions.   Remember to brush your teeth WITH YOUR REGULAR TOOTHPASTE.   Please read over the following fact sheets that you were given.

## 2019-11-13 ENCOUNTER — Encounter (HOSPITAL_COMMUNITY): Payer: Self-pay

## 2019-11-13 ENCOUNTER — Encounter (HOSPITAL_COMMUNITY)
Admission: RE | Admit: 2019-11-13 | Discharge: 2019-11-13 | Disposition: A | Discharge status: Home / Self Care | Payer: Medicare PPO | Source: Ambulatory Visit | Attending: Student | Admitting: Student

## 2019-11-13 ENCOUNTER — Other Ambulatory Visit: Payer: Self-pay

## 2019-11-13 ENCOUNTER — Other Ambulatory Visit (HOSPITAL_COMMUNITY)
Admission: RE | Admit: 2019-11-13 | Discharge: 2019-11-13 | Disposition: A | Discharge status: Home / Self Care | Payer: Medicare PPO | Source: Ambulatory Visit | Attending: General Surgery | Admitting: General Surgery

## 2019-11-13 DIAGNOSIS — Z01812 Encounter for preprocedural laboratory examination: Secondary | ICD-10-CM | POA: Diagnosis not present

## 2019-11-13 DIAGNOSIS — Z20822 Contact with and (suspected) exposure to covid-19: Secondary | ICD-10-CM | POA: Diagnosis not present

## 2019-11-13 HISTORY — DX: Other specified health status: Z78.9

## 2019-11-13 LAB — CBC
HCT: 38.4 % (ref 36.0–46.0)
Hemoglobin: 11.8 g/dL — ABNORMAL LOW (ref 12.0–15.0)
MCH: 27.6 pg (ref 26.0–34.0)
MCHC: 30.7 g/dL (ref 30.0–36.0)
MCV: 89.9 fL (ref 80.0–100.0)
Platelets: 314 10*3/uL (ref 150–400)
RBC: 4.27 MIL/uL (ref 3.87–5.11)
RDW: 14.5 % (ref 11.5–15.5)
WBC: 5.7 10*3/uL (ref 4.0–10.5)
nRBC: 0 % (ref 0.0–0.2)

## 2019-11-13 LAB — SURGICAL PCR SCREEN
MRSA, PCR: NEGATIVE
Staphylococcus aureus: NEGATIVE

## 2019-11-13 LAB — SARS CORONAVIRUS 2 (TAT 6-24 HRS): SARS Coronavirus 2: NEGATIVE

## 2019-11-13 NOTE — Progress Notes (Signed)
PCP - [facility] Cardiologist - denies   Chest x-ray - n/a EKG - n/a Stress Test - denies ECHO - denies Cardiac Cath - denies  Blood Thinner Instructions: n/a Aspirin Instructions: n/a  ERAS Protcol - yes clears until 0600 PRE-SURGERY Ensure or G2- ensure given  COVID TEST- today 11/13/19   Anesthesia review: NO  Patient denies shortness of breath, fever, cough and chest pain at PAT appointment   All instructions explained to the patient, with a verbal understanding of the material. Patient agrees to go over the instructions while at home for a better understanding. Patient also instructed to self quarantine after being tested for COVID-19. The opportunity to ask questions was provided.

## 2019-11-14 NOTE — Anesthesia Preprocedure Evaluation (Addendum)
Anesthesia Evaluation  Patient identified by MRN, date of birth, ID band Patient awake    Reviewed: Allergy & Precautions, NPO status , Patient's Chart, lab work & pertinent test results  History of Anesthesia Complications (+) Emergence Delirium  Airway Mallampati: II  TM Distance: >3 FB Neck ROM: Full    Dental no notable dental hx. (+) Teeth Intact, Dental Advisory Given,    Pulmonary neg pulmonary ROS,    Pulmonary exam normal breath sounds clear to auscultation       Cardiovascular negative cardio ROS Normal cardiovascular exam Rhythm:Regular Rate:Normal     Neuro/Psych negative neurological ROS  negative psych ROS   GI/Hepatic negative GI ROS, Neg liver ROS,   Endo/Other  negative endocrine ROS  Renal/GU negative Renal ROS     Musculoskeletal  (+) Arthritis ,   Abdominal (+) + obese,   Peds  Hematology Hgb 11.8 Plt 318   Anesthesia Other Findings   Reproductive/Obstetrics                           Anesthesia Physical Anesthesia Plan  ASA: II  Anesthesia Plan: General   Post-op Pain Management:  Regional for Post-op pain   Induction:   PONV Risk Score and Plan: 3 and Treatment may vary due to age or medical condition, Ondansetron and Dexamethasone  Airway Management Planned: Oral ETT  Additional Equipment: None  Intra-op Plan:   Post-operative Plan: Extubation in OR  Informed Consent: I have reviewed the patients History and Physical, chart, labs and discussed the procedure including the risks, benefits and alternatives for the proposed anesthesia with the patient or authorized representative who has indicated his/her understanding and acceptance.     Dental advisory given  Plan Discussed with: CRNA and Anesthesiologist  Anesthesia Plan Comments: (GA w R ISB)       Anesthesia Quick Evaluation

## 2019-11-15 ENCOUNTER — Ambulatory Visit (HOSPITAL_COMMUNITY): Payer: Medicare PPO

## 2019-11-15 ENCOUNTER — Other Ambulatory Visit: Payer: Self-pay

## 2019-11-15 ENCOUNTER — Ambulatory Visit (HOSPITAL_COMMUNITY): Payer: Medicare PPO | Admitting: Anesthesiology

## 2019-11-15 ENCOUNTER — Encounter (HOSPITAL_COMMUNITY)
Admission: RE | Disposition: A | Discharge status: Home / Self Care | Payer: Self-pay | Source: Home / Self Care | Attending: Student

## 2019-11-15 ENCOUNTER — Ambulatory Visit (HOSPITAL_COMMUNITY)
Admission: RE | Admit: 2019-11-15 | Discharge: 2019-11-15 | Disposition: A | Discharge status: Home / Self Care | Payer: Medicare PPO | Attending: Student | Admitting: Student

## 2019-11-15 ENCOUNTER — Encounter (HOSPITAL_COMMUNITY): Payer: Self-pay | Admitting: Student

## 2019-11-15 DIAGNOSIS — S52121D Displaced fracture of head of right radius, subsequent encounter for closed fracture with routine healing: Secondary | ICD-10-CM | POA: Diagnosis not present

## 2019-11-15 DIAGNOSIS — S53104A Unspecified dislocation of right ulnohumeral joint, initial encounter: Secondary | ICD-10-CM | POA: Insufficient documentation

## 2019-11-15 DIAGNOSIS — S52121A Displaced fracture of head of right radius, initial encounter for closed fracture: Secondary | ICD-10-CM | POA: Insufficient documentation

## 2019-11-15 DIAGNOSIS — Z419 Encounter for procedure for purposes other than remedying health state, unspecified: Secondary | ICD-10-CM

## 2019-11-15 DIAGNOSIS — E559 Vitamin D deficiency, unspecified: Secondary | ICD-10-CM | POA: Diagnosis not present

## 2019-11-15 DIAGNOSIS — S42401A Unspecified fracture of lower end of right humerus, initial encounter for closed fracture: Secondary | ICD-10-CM

## 2019-11-15 DIAGNOSIS — S53441A Ulnar collateral ligament sprain of right elbow, initial encounter: Secondary | ICD-10-CM | POA: Diagnosis not present

## 2019-11-15 DIAGNOSIS — S42131A Displaced fracture of coracoid process, right shoulder, initial encounter for closed fracture: Secondary | ICD-10-CM | POA: Diagnosis not present

## 2019-11-15 DIAGNOSIS — G8918 Other acute postprocedural pain: Secondary | ICD-10-CM | POA: Diagnosis not present

## 2019-11-15 DIAGNOSIS — W1830XA Fall on same level, unspecified, initial encounter: Secondary | ICD-10-CM | POA: Insufficient documentation

## 2019-11-15 DIAGNOSIS — T148XXA Other injury of unspecified body region, initial encounter: Secondary | ICD-10-CM

## 2019-11-15 DIAGNOSIS — S52041A Displaced fracture of coronoid process of right ulna, initial encounter for closed fracture: Secondary | ICD-10-CM | POA: Diagnosis not present

## 2019-11-15 HISTORY — PX: ORIF ELBOW FRACTURE: SHX5031

## 2019-11-15 SURGERY — OPEN REDUCTION INTERNAL FIXATION (ORIF) ELBOW/OLECRANON FRACTURE
Anesthesia: General | Site: Elbow | Laterality: Right

## 2019-11-15 MED ORDER — PROPOFOL 10 MG/ML IV BOLUS
INTRAVENOUS | Status: DC | PRN
  Administered 2019-11-15: 120 mg via INTRAVENOUS

## 2019-11-15 MED ORDER — 0.9 % SODIUM CHLORIDE (POUR BTL) OPTIME
TOPICAL | Status: DC | PRN
  Administered 2019-11-15: 1000 mL

## 2019-11-15 MED ORDER — DEXAMETHASONE SODIUM PHOSPHATE 10 MG/ML IJ SOLN
INTRAMUSCULAR | Status: DC | PRN
  Administered 2019-11-15: 5 mg via INTRAVENOUS

## 2019-11-15 MED ORDER — BUPIVACAINE-EPINEPHRINE (PF) 0.5% -1:200000 IJ SOLN
INTRAMUSCULAR | Status: DC | PRN
  Administered 2019-11-15: 16 mL via PERINEURAL

## 2019-11-15 MED ORDER — PHENYLEPHRINE HCL (PRESSORS) 10 MG/ML IV SOLN
INTRAVENOUS | Status: DC | PRN
  Administered 2019-11-15: 100 ug via INTRAVENOUS

## 2019-11-15 MED ORDER — VANCOMYCIN HCL 1000 MG IV SOLR
INTRAVENOUS | Status: DC | PRN
  Administered 2019-11-15: 1000 mg via TOPICAL

## 2019-11-15 MED ORDER — OXYCODONE HCL 5 MG PO TABS: 5.0000 mg | ORAL_TABLET | Freq: Four times a day (QID) | ORAL | 0 refills | Status: DC | PRN

## 2019-11-15 MED ORDER — FENTANYL CITRATE (PF) 250 MCG/5ML IJ SOLN
INTRAMUSCULAR | Status: AC
  Filled 2019-11-15: qty 5

## 2019-11-15 MED ORDER — BUPIVACAINE LIPOSOME 1.3 % IJ SUSP
INTRAMUSCULAR | Status: DC | PRN
  Administered 2019-11-15: 10 mL via PERINEURAL

## 2019-11-15 MED ORDER — OXYCODONE HCL 5 MG PO TABS
ORAL_TABLET | ORAL | Status: DC
Start: 2019-11-15 — End: 2019-11-15
  Filled 2019-11-15: qty 1

## 2019-11-15 MED ORDER — MIDAZOLAM HCL 2 MG/2ML IJ SOLN
INTRAMUSCULAR | Status: AC
  Administered 2019-11-15: 1 mg via INTRAVENOUS
  Filled 2019-11-15: qty 2

## 2019-11-15 MED ORDER — ORAL CARE MOUTH RINSE: 15.0000 mL | Freq: Once | OROMUCOSAL | Status: AC

## 2019-11-15 MED ORDER — FENTANYL CITRATE (PF) 100 MCG/2ML IJ SOLN: 25.0000 ug | INTRAMUSCULAR | Status: DC | PRN

## 2019-11-15 MED ORDER — OXYCODONE HCL 5 MG PO TABS
5.0000 mg | ORAL_TABLET | Freq: Once | ORAL | Status: AC
  Administered 2019-11-15: 5 mg via ORAL

## 2019-11-15 MED ORDER — PHENYLEPHRINE HCL-NACL 10-0.9 MG/250ML-% IV SOLN
INTRAVENOUS | Status: DC | PRN
  Administered 2019-11-15: 50 ug/min via INTRAVENOUS

## 2019-11-15 MED ORDER — VANCOMYCIN HCL 1000 MG IV SOLR
INTRAVENOUS | Status: AC
  Filled 2019-11-15: qty 1000

## 2019-11-15 MED ORDER — LIDOCAINE 2% (20 MG/ML) 5 ML SYRINGE
INTRAMUSCULAR | Status: DC | PRN
  Administered 2019-11-15: 30 mg via INTRAVENOUS

## 2019-11-15 MED ORDER — ACETAMINOPHEN 10 MG/ML IV SOLN: 1000.0000 mg | Freq: Once | INTRAVENOUS | Status: DC | PRN

## 2019-11-15 MED ORDER — CHLORHEXIDINE GLUCONATE 0.12 % MT SOLN
15.0000 mL | Freq: Once | OROMUCOSAL | Status: AC
  Administered 2019-11-15: 15 mL via OROMUCOSAL
  Filled 2019-11-15: qty 15

## 2019-11-15 MED ORDER — LACTATED RINGERS IV SOLN: INTRAVENOUS | Status: DC | PRN

## 2019-11-15 MED ORDER — ROCURONIUM BROMIDE 100 MG/10ML IV SOLN
INTRAVENOUS | Status: DC | PRN
  Administered 2019-11-15: 50 mg via INTRAVENOUS

## 2019-11-15 MED ORDER — FENTANYL CITRATE (PF) 100 MCG/2ML IJ SOLN: 50.0000 ug | Freq: Once | INTRAMUSCULAR | Status: AC

## 2019-11-15 MED ORDER — ONDANSETRON HCL 4 MG/2ML IJ SOLN: 4.0000 mg | Freq: Once | INTRAMUSCULAR | Status: DC | PRN

## 2019-11-15 MED ORDER — METHOCARBAMOL 500 MG PO TABS: 500.0000 mg | ORAL_TABLET | Freq: Four times a day (QID) | ORAL | 0 refills | Status: DC | PRN

## 2019-11-15 MED ORDER — CEFAZOLIN SODIUM-DEXTROSE 2-4 GM/100ML-% IV SOLN
2.0000 g | INTRAVENOUS | Status: AC
  Administered 2019-11-15: 2 g via INTRAVENOUS
  Filled 2019-11-15: qty 100

## 2019-11-15 MED ORDER — ONDANSETRON HCL 4 MG/2ML IJ SOLN
INTRAMUSCULAR | Status: DC | PRN
  Administered 2019-11-15: 4 mg via INTRAVENOUS

## 2019-11-15 MED ORDER — FENTANYL CITRATE (PF) 100 MCG/2ML IJ SOLN
INTRAMUSCULAR | Status: AC
  Administered 2019-11-15: 50 ug via INTRAVENOUS
  Filled 2019-11-15: qty 2

## 2019-11-15 MED ORDER — FENTANYL CITRATE (PF) 100 MCG/2ML IJ SOLN
INTRAMUSCULAR | Status: DC | PRN
Start: 2019-11-15 — End: 2019-11-15
  Administered 2019-11-15: 100 ug via INTRAVENOUS

## 2019-11-15 MED ORDER — SUGAMMADEX SODIUM 200 MG/2ML IV SOLN
INTRAVENOUS | Status: DC | PRN
  Administered 2019-11-15: 200 mg via INTRAVENOUS

## 2019-11-15 MED ORDER — MIDAZOLAM HCL 2 MG/2ML IJ SOLN: 1.0000 mg | Freq: Once | INTRAMUSCULAR | Status: AC

## 2019-11-15 MED ORDER — PROPOFOL 10 MG/ML IV BOLUS
INTRAVENOUS | Status: AC
  Filled 2019-11-15: qty 20

## 2019-11-15 MED ORDER — LACTATED RINGERS IV SOLN: INTRAVENOUS | Status: DC

## 2019-11-15 SURGICAL SUPPLY — 93 items
ANCH SUT 1 SHRT SM RGD INSRTR (Anchor) ×1 IMPLANT
ANCH SUT 2 SHRT 1.45 DRLBT (Orthopedic Implant) ×1 IMPLANT
ANCHOR JUGGERKNOT W/DRL 2/1.45 (Orthopedic Implant) ×2 IMPLANT
ANCHOR SUT 1.45 SZ 1 SHORT (Anchor) ×3 IMPLANT
APL PRP STRL LF DISP 70% ISPRP (MISCELLANEOUS) ×1
APL SKNCLS STERI-STRIP NONHPOA (GAUZE/BANDAGES/DRESSINGS)
BENZOIN TINCTURE PRP APPL 2/3 (GAUZE/BANDAGES/DRESSINGS) IMPLANT
BLADE AVERAGE 25MMX9MM (BLADE) ×1
BLADE AVERAGE 25X9 (BLADE) ×2 IMPLANT
BLADE CLIPPER SURG (BLADE) ×1 IMPLANT
BLADE SURG 10 STRL SS (BLADE) IMPLANT
BNDG CMPR 9X4 STRL LF SNTH (GAUZE/BANDAGES/DRESSINGS) ×1
BNDG CMPR MED 10X6 ELC LF (GAUZE/BANDAGES/DRESSINGS) ×1
BNDG COHESIVE 4X5 TAN STRL (GAUZE/BANDAGES/DRESSINGS) ×3 IMPLANT
BNDG ELASTIC 3X5.8 VLCR STR LF (GAUZE/BANDAGES/DRESSINGS) ×3 IMPLANT
BNDG ELASTIC 4X5.8 VLCR STR LF (GAUZE/BANDAGES/DRESSINGS) ×3 IMPLANT
BNDG ELASTIC 6X10 VLCR STRL LF (GAUZE/BANDAGES/DRESSINGS) ×2 IMPLANT
BNDG ELASTIC 6X5.8 VLCR STR LF (GAUZE/BANDAGES/DRESSINGS) ×3 IMPLANT
BNDG ESMARK 4X9 LF (GAUZE/BANDAGES/DRESSINGS) ×2 IMPLANT
BNDG GAUZE ELAST 4 BULKY (GAUZE/BANDAGES/DRESSINGS) ×3 IMPLANT
BRUSH SCRUB EZ PLAIN DRY (MISCELLANEOUS) ×6 IMPLANT
CHLORAPREP W/TINT 26 (MISCELLANEOUS) ×3 IMPLANT
CLEANER TIP ELECTROSURG 2X2 (MISCELLANEOUS) ×3 IMPLANT
CLOSURE STERI-STRIP 1/2X4 (GAUZE/BANDAGES/DRESSINGS) ×1
CLOSURE WOUND 1/2 X4 (GAUZE/BANDAGES/DRESSINGS)
CLSR STERI-STRIP ANTIMIC 1/2X4 (GAUZE/BANDAGES/DRESSINGS) ×1 IMPLANT
COVER SURGICAL LIGHT HANDLE (MISCELLANEOUS) ×4 IMPLANT
COVER WAND RF STERILE (DRAPES) ×1 IMPLANT
CUFF TOURN SGL QUICK 18X4 (TOURNIQUET CUFF) ×2 IMPLANT
CUFF TOURN SGL QUICK 24 (TOURNIQUET CUFF)
CUFF TRNQT CYL 24X4X16.5-23 (TOURNIQUET CUFF) IMPLANT
DECANTER SPIKE VIAL GLASS SM (MISCELLANEOUS) IMPLANT
DRAPE C-ARM 42X72 X-RAY (DRAPES) ×3 IMPLANT
DRAPE C-ARMOR (DRAPES) ×3 IMPLANT
DRAPE INCISE IOBAN 66X45 STRL (DRAPES) ×2 IMPLANT
DRAPE ORTHO SPLIT 77X108 STRL (DRAPES) ×6
DRAPE SURG ORHT 6 SPLT 77X108 (DRAPES) ×2 IMPLANT
DRAPE U-SHAPE 47X51 STRL (DRAPES) ×3 IMPLANT
DRSG ADAPTIC 3X8 NADH LF (GAUZE/BANDAGES/DRESSINGS) IMPLANT
ELECT REM PT RETURN 9FT ADLT (ELECTROSURGICAL) ×3
ELECTRODE REM PT RTRN 9FT ADLT (ELECTROSURGICAL) ×1 IMPLANT
GAUZE SPONGE 4X4 12PLY STRL (GAUZE/BANDAGES/DRESSINGS) ×3 IMPLANT
GAUZE SPONGE 4X4 16PLY XRAY LF (GAUZE/BANDAGES/DRESSINGS) ×2 IMPLANT
GAUZE XEROFORM 5X9 LF (GAUZE/BANDAGES/DRESSINGS) ×3 IMPLANT
GLOVE BIO SURGEON STRL SZ 6.5 (GLOVE) ×6 IMPLANT
GLOVE BIO SURGEON STRL SZ7.5 (GLOVE) ×12 IMPLANT
GLOVE BIO SURGEONS STRL SZ 6.5 (GLOVE) ×3
GLOVE BIOGEL PI IND STRL 6.5 (GLOVE) ×1 IMPLANT
GLOVE BIOGEL PI IND STRL 7.5 (GLOVE) ×1 IMPLANT
GLOVE BIOGEL PI INDICATOR 6.5 (GLOVE) ×2
GLOVE BIOGEL PI INDICATOR 7.5 (GLOVE) ×2
GOWN STRL REUS W/ TWL LRG LVL3 (GOWN DISPOSABLE) ×2 IMPLANT
GOWN STRL REUS W/TWL LRG LVL3 (GOWN DISPOSABLE) ×6
HEAD RADIAL EXPLOR 14X24MM (Head) ×2 IMPLANT
IMPL STEM WITH SCREW 8X29 (Stem) IMPLANT
IMPLANT STEM WITH SCREW 8X29 (Stem) ×3 IMPLANT
KIT BASIN OR (CUSTOM PROCEDURE TRAY) ×3 IMPLANT
KIT TURNOVER KIT B (KITS) ×3 IMPLANT
MANIFOLD NEPTUNE II (INSTRUMENTS) ×3 IMPLANT
NS IRRIG 1000ML POUR BTL (IV SOLUTION) ×3 IMPLANT
PACK ORTHO EXTREMITY (CUSTOM PROCEDURE TRAY) ×3 IMPLANT
PAD ABD 8X10 STRL (GAUZE/BANDAGES/DRESSINGS) ×2 IMPLANT
PAD ARMBOARD 7.5X6 YLW CONV (MISCELLANEOUS) ×6 IMPLANT
PAD CAST 4YDX4 CTTN HI CHSV (CAST SUPPLIES) ×1 IMPLANT
PADDING CAST ABS 6INX4YD NS (CAST SUPPLIES) ×2
PADDING CAST ABS COTTON 6X4 NS (CAST SUPPLIES) IMPLANT
PADDING CAST COTTON 4X4 STRL (CAST SUPPLIES) ×3
RETRIEVER SUT HEWSON (MISCELLANEOUS) ×2 IMPLANT
SPLINT PLASTER CAST XFAST 5X30 (CAST SUPPLIES) IMPLANT
SPLINT PLASTER XFAST SET 5X30 (CAST SUPPLIES) ×2
SPONGE LAP 18X18 RF (DISPOSABLE) ×6 IMPLANT
STAPLER VISISTAT 35W (STAPLE) ×1 IMPLANT
STRIP CLOSURE SKIN 1/2X4 (GAUZE/BANDAGES/DRESSINGS) IMPLANT
SUCTION FRAZIER HANDLE 10FR (MISCELLANEOUS) ×3
SUCTION TUBE FRAZIER 10FR DISP (MISCELLANEOUS) ×1 IMPLANT
SUT MNCRL AB 3-0 PS2 18 (SUTURE) ×1 IMPLANT
SUT MON AB 2-0 CT1 36 (SUTURE) ×1 IMPLANT
SUT PDS AB 2-0 CT1 27 (SUTURE) IMPLANT
SUT PROLENE 0 CT (SUTURE) IMPLANT
SUT PROLENE 3 0 PS 2 (SUTURE) ×6 IMPLANT
SUT VIC AB 0 CT1 27 (SUTURE) ×6
SUT VIC AB 0 CT1 27XBRD ANBCTR (SUTURE) ×2 IMPLANT
SUT VIC AB 2-0 CT1 27 (SUTURE) ×6
SUT VIC AB 2-0 CT1 TAPERPNT 27 (SUTURE) ×2 IMPLANT
SUT VIC AB 2-0 CT3 27 (SUTURE) IMPLANT
SYR CONTROL 10ML LL (SYRINGE) ×3 IMPLANT
TOWEL GREEN STERILE (TOWEL DISPOSABLE) ×6 IMPLANT
TOWEL GREEN STERILE FF (TOWEL DISPOSABLE) IMPLANT
TUBE CONNECTING 12'X1/4 (SUCTIONS) ×1
TUBE CONNECTING 12X1/4 (SUCTIONS) ×2 IMPLANT
UNDERPAD 30X36 HEAVY ABSORB (UNDERPADS AND DIAPERS) ×3 IMPLANT
WATER STERILE IRR 1000ML POUR (IV SOLUTION) IMPLANT
YANKAUER SUCT BULB TIP NO VENT (SUCTIONS) ×3 IMPLANT

## 2019-11-15 NOTE — Transfer of Care (Signed)
Immediate Anesthesia Transfer of Care Note  Patient: Theresa Villarreal  Procedure(s) Performed: OPEN REDUCTION INTERNAL FIXATION (ORIF) ELBOW FRACTURE with radial head replacement (Right Elbow)  Patient Location: PACU  Anesthesia Type:GA combined with regional for post-op pain  Level of Consciousness: awake and alert   Airway & Oxygen Therapy: Patient Spontanous Breathing and Patient connected to nasal cannula oxygen  Post-op Assessment: Report given to RN and Post -op Vital signs reviewed and stable  Post vital signs: Reviewed and stable  Last Vitals:  Vitals Value Taken Time  BP 122/80 11/15/19 1443  Temp    Pulse 76 11/15/19 1445  Resp 16 11/15/19 1445  SpO2 94 % 11/15/19 1445  Vitals shown include unvalidated device data.  Last Pain:  Vitals:   11/15/19 1443  PainSc: (P) 0-No pain         Complications: No complications documented.

## 2019-11-15 NOTE — Anesthesia Procedure Notes (Addendum)
Anesthesia Regional Block: Interscalene brachial plexus block   Pre-Anesthetic Checklist: ,, timeout performed, Correct Patient, Correct Site, Correct Laterality, Correct Procedure, Correct Position, site marked, Risks and benefits discussed,  Surgical consent,  Pre-op evaluation,  At surgeon's request and post-op pain management  Laterality: Upper and Right  Prep: Maximum Sterile Barrier Precautions used, chloraprep       Needles:  Injection technique: Single-shot  Needle Type: Echogenic Needle     Needle Length: 5cm  Needle Gauge: 21     Additional Needles:   Procedures:,,,, ultrasound used (permanent image in chart),,,,  Narrative:  Start time: 11/15/2019 10:21 AM End time: 11/15/2019 10:27 AM Injection made incrementally with aspirations every 5 mL.  Performed by: Personally  Anesthesiologist: Barnet Glasgow, MD  Additional Notes: Block assessed prior to procedure. Patient tolerated procedure well.

## 2019-11-15 NOTE — Op Note (Signed)
Orthopaedic Surgery Operative Note (CSN: [id number] ) Date of Surgery: 11/15/2019  Admit Date: 11/15/2019   Diagnoses: Pre-Op Diagnoses: Right elbow dislocation Right radial head fracture Right coronoid fracture  Post-Op Diagnosis: Same  Procedures: 1. CPT 24586-Open reduction of right elbow dislocation with radial head replacement 2. CPT 24343-Lateral ligament repair right elbow 3. CPT 24685-Repair of right coronoid fracture  Surgeons : Primary: Shona Needles, MD  Assistant: Patrecia Pace, PA-C  Location: OR 3   Anesthesia:General with regional anesthesia  Antibiotics: Ancef 2g preop with 1 gm vancomycin powder placed topically   Tourniquet time: Total Tourniquet Time Documented: Upper Arm (Right) - 60 minutes Total: Upper Arm (Right) - 60 minutes  Estimated Blood Loss:Minimal  Complications:None   Specimens:None   Implants: Implant Name Type Inv. Item Serial No. Manufacturer Lot No. LRB No. Used Action  HEAD RADIAL EXPLOR 14X24MM - [id number] Head HEAD RADIAL EXPLOR 14X24MM  [employer](ORTH,TRAU,BIO,SG) [id number] Right 1 Implanted  IMPLANT STEM WITH SCREW 8X29 - [id number] Stem IMPLANT STEM WITH SCREW 8X29  [employer](ORTH,TRAU,BIO,SG) [id number] Right 1 Implanted  KIT IMPLANT JUGGERKNOT SZ2 - [id number] Orthopedic Implant KIT IMPLANT JUGGERKNOT SZ2  ZIMMER [employer]ON(ORTH,TRAU,BIO,SG) [id number] Right 1 Implanted  ANCHOR SUTURE 1.45 SZ 1 SHORT - [id number] Anchor ANCHOR SUTURE 1.45 SZ 1 SHORT  [employer]MER [employer]ON(ORTH,TRAU,BIO,SG) [id number] Right 1 Implanted     Indications for Surgery: 69 year old female who sustained a ground-level fall.  She had a fracture dislocation of her right elbow with associated radial head fracture as well as coronoid fracture.  Due to the unstable nature of her injury I recommended proceeding with open reduction internal fixation of her elbow with a radial head replacement and lateral ligament repair.  I discussed risks and benefits with the patient.  Risks  included but not limited to bleeding, infection, malunion, nonunion, hardware failure, hardware irritation, nerve and blood vessel injury, elbow instability, elbow stiffness, need for further surgery, even the possibility anesthetic complications.  The patient agreed to proceed with surgery and consent was obtained.  Operative Findings: 1.  Unstable terrible triad injury with radial head fracture dislocation, coronoid fracture and ulnohumeral dislocation. 2.  Open reduction of right elbow dislocation with radial head replacement using a [employer] Explor 14x24 radial head with 75m stem 3.  Repair of coronoid fracture using #2 FiberWire through the anterior capsule and pulled through drill holes of the proximal ulna and tied down to the dorsal ulna 4.  Lateral ligament repair of the right elbow using Zimmer [employer]omet Juggerknot suture anchors  Procedure: The patient was identified in the preoperative holding area. Consent was confirmed with the patient and their family and all questions were answered. The operative extremity was marked after confirmation with the patient. she was then brought back to the operating room by our anesthesia colleagues.  She was carefully transferred over to a radiolucent flat top table.  She was placed under general anesthetic.  A nonsterile tourniquet was placed to her upper arm.  The right upper extremity was prepped and draped in usual sterile fashion.  Timeout was performed to verify the patient, the procedure, and the extremity.  Preoperative antibiotics were dosed.  Fluoroscopic imaging showed the unstable nature of her elbow injury including a dislocation of her ulnohumeral joint.  A lateral approach to the elbow was made through skin and subcutaneous tissue after the Esmarch was used to exsanguinate the extremity and the tourniquet was inflated.  The standard Kocher interval was developed and extended proximally and distally.  I incised  through the annular ligament to  expose the radial head.  The radial head fragments that were removed.  The elbow was unstable.  The lateral ulnar collateral ligament had avulsed off the lateral epicondyle on the humerus.  I proceeded to clean out the elbow joint and remove any soft tissue.  I removed the remaining fragments of the radial head.  Good portion of the radial head was still in place attached to the radial neck.  I used an oscillating saw to remove the remainder of the radial head and expose the anterior capsule.  There was a coronoid fragment that was too small to provide fixation with screws but I felt that repair of this was necessary to provide elbow stability.  As result I passed a #2 FiberWire sutures through the capsule around the coronoid to pull-through of the proximal ulna.  I used a 2.5 mm drill bit to drill through an accessory incision on the dorsal proximal ulna and used a [place] suture passer to pass the sutures through the proximal ulna and tagged them with hemostats.  I would repair this at the end of the case.  I then proceeded to broach the radial neck.  I was able to get good fit with 8 mm radial head stem and trialed off this with a 24 x 14 mm radial head.  I had good fit without overstuffing the radiocapitellar joint.  I then removed the trial and placed the final implants.  I then reduced the ulnohumeral joint in place and then I drilled and placed 2 juggernaut sutures over the lateral epicondyle.  I then repaired the lateral ulnar collateral ligament back down to the epicondyle.  I then used sutures to close the Kocher interval and the extensor wad as well as the annular ligament.    Final fluoroscopic imaging was obtained.  The elbow showed that it was stable.  I then proceeded to tie down the FiberWire for the coronoid fixation.  The interval was then closed with a 0 Vicryl suture in a running fashion.  A gram of vancomycin powder was placed into the incision.  The skin was then closed with 2-0 Vicryl  and 3-0 Monocryl.  Steri-Strips were placed.  A sterile dressing consisting of 4 x 4 sterile cast padding and a well-padded long-arm splint was placed in neutral rotation with 90 degrees of flexion.  The patient was taken to the PACU in stable condition.  Post Op Plan/Instructions: Patient will be nonweightbearing to the right upper extremity.  She will remain in her splint until postoperative follow-up wound get x-rays and evaluate her incision.  We will likely transition to a hinged elbow brace to start gentle range of motion.  No DVT prophylaxis is needed in this healthy ambulatory upper extremity patient.  She will be discharged home from the PACU.  I was present and performed the entire surgery.  Patrecia Pace, PA-C did assist me throughout the case. An assistant was necessary given the difficulty in approach, maintenance of reduction and ability to instrument the fracture.   Katha Hamming, MD [facility]

## 2019-11-15 NOTE — Discharge Instructions (Addendum)
Katha Hamming, MD Patrecia Pace PA-C [facility] [place] [phone] [place] [phone] (fax)                                  POST-OPERATIVE INSTRUCTIONS   WEIGHT BEARING STATUS: Non-weightbearing right arm  RANGE OF MOTION/ACTIVITY:  Wiggle fingers frequently. Wear sling as needed for comfort WOUND CARE ? Please keep splint clean dry and intact until follow-up. If your splint gets wet for any reason please contact the office immediately.  Do not stick anything down your splint such as pencils, momey, hangers to try and scratch yourself.  If you feel itchy take Benadryl as prescribed on the bottle for itching ? You may shower on Post-Op Day #2.  ? You must keep splint dry during this process and may find that a plastic bag taped around the extremity or alternatively a towel based bath may be a better option.   ? If you get your splint wet or if it is damaged please contact our clinic.  EXERCISES ? Due to your splint being in place you will not be able to bear weight through your extremity.   Please use your sling until follow-up. ? Please continue to work on range of motion of your fingers and stretch these multiple times a day to prevent stiffness. ? Please continue to ambulate and do not stay sitting or lying for too long. Perform foot and wrist pumps to assist in circulation.  DVT/PE prophylaxis: None  DIET: As you were eating previously.  Can use over the counter stool softeners and bowel preparations, such as Miralax, to help with bowel movements.  Narcotics can be constipating.  Be sure to drink plenty of fluids  REGIONAL ANESTHESIA (NERVE BLOCKS)  The anesthesia team may have performed a nerve block for you if safe in the setting of your care.  This is a great tool used to minimize pain.  Typically the block may start wearing off overnight but the long acting medicine may last for 3-4 days.  The nerve block wearing off can be a challenging  period but please utilize your as needed pain medications to try and manage this period.    ICE AND ELEVATE INJURED/OPERATIVE EXTREMITY  Using ice and elevating the injured extremity above your heart can help with swelling and pain control.  Icing in a pulsatile fashion, such as 20 minutes on and 20 minutes off, can be followed.    Do not place ice directly on skin. Make sure there is a barrier between to skin and the ice pack.    Using frozen items such as frozen peas works well as the conform nicely to the are that needs to be iced.  POST-OP MEDICATIONS- Multimodal approach to pain control  In general your pain will be controlled with a combination of substances.  Prescriptions unless otherwise discussed are electronically sent to your pharmacy.  This is a carefully made plan we use to minimize narcotic use.                     -   Acetaminophen - Non-narcotic pain medicine taken on a scheduled basis        -   Oxycodone - This is a strong narcotic, to be used only on an "as needed" basis for pain.                  -  Robaxin -  Muscle relaxer taken on an "as needed" basis for muscle spasms                  -   Gabapentin - Helps with nerve pain, such as burning or tingling feeling in the operative arm             STOP SMOKING OR USING NICOTINE PRODUCTS!!!!  As discussed nicotine severely impairs your body's ability to heal surgical and traumatic wounds but also impairs bone healing.  Wounds and bone heal by forming microscopic blood vessels (angiogenesis) and nicotine is a vasoconstrictor (essentially, shrinks blood vessels).  Therefore, if vasoconstriction occurs to these microscopic blood vessels they essentially disappear and are unable to deliver necessary nutrients to the healing tissue.  This is one modifiable factor that you can do to dramatically increase your chances of healing your injury.    (This means no smoking, no nicotine gum, patches, etc)   FOLLOW-UP ? If you develop a  Fever (>101.5), Redness or Drainage from the surgical incision site, please call our office to arrange for an evaluation. ? Please call the office to schedule a follow-up appointment for your incision check if you do not already have one, 7-10 days post-operatively.  CALL THE OFFICE WITH ANY QUESTIONS OR CONCERNS: [phone]   VISIT OUR WEBSITE FOR ADDITIONAL INFORMATION: [email or web address]    OTHER HELPFUL INFORMATION   If you had a block, it will wear off between 8-24 hrs postop typically.  This is period when your pain may go from nearly zero to the pain you would have had postop without the block.  This is an abrupt transition but nothing dangerous is happening.  You may take an extra dose of narcotic when this happens.   You may be more comfortable sleeping in a semi-seated position the first few nights following surgery.  Keep a pillow propped under the elbow and forearm for comfort.  If you have a recliner type of chair it might be beneficial.  If not that is fine too, but it would be helpful to sleep propped up with pillows behind your operated shoulder as well under your elbow and forearm.  This will reduce pulling on the suture lines.   When dressing, put your operative arm in the sleeve first.  When getting undressed, take your operative arm out last.  Loose fitting, button-down shirts are recommended.  Often in the first days after surgery you may be more comfortable keeping your operative arm under your shirt and not through the sleeve.   You may return to work/school in the next couple of days when you feel up to it.  Desk work and typing in the sling is fine.   We suggest you use the pain medication the first night prior to going to bed, in order to ease any pain when the anesthesia wears off. You should avoid taking pain medications on an empty stomach as it will make you nauseous.  You should wean off your narcotic medicines as soon as you are able.  Most patients will be  off or using minimal narcotics before their first postop appointment.    Do not drink alcoholic beverages or take illicit drugs when taking pain medications.   It is against the law to drive while taking narcotics.  In some states it is against the law to drive while your arm is in a sling.    Pain medication may make you constipated.  Below are a few  solutions to try in this order:  Decrease the amount of pain medication if you aren't having pain.  Drink lots of decaffeinated fluids.  Drink prune juice and/or each dried prunes   If the first 3 don't work start with additional solutions -   Take Colace - an over-the-counter stool softener -   Take Senokot - an over-the-counter laxative -   Take Miralax - a stronger over-the-counter laxative

## 2019-11-15 NOTE — Anesthesia Postprocedure Evaluation (Signed)
Anesthesia Post Note  Patient: Theresa Villarreal  Procedure(s) Performed: OPEN REDUCTION INTERNAL FIXATION (ORIF) ELBOW FRACTURE with radial head replacement (Right Elbow)     Patient location during evaluation: PACU Anesthesia Type: General Level of consciousness: awake and alert Pain management: pain level controlled Vital Signs Assessment: post-procedure vital signs reviewed and stable Respiratory status: spontaneous breathing, nonlabored ventilation, respiratory function stable and patient connected to nasal cannula oxygen Cardiovascular status: blood pressure returned to baseline and stable Postop Assessment: no apparent nausea or vomiting Anesthetic complications: no   No complications documented.  Last Vitals:  Vitals:   11/15/19 1445 11/15/19 1456  BP:  136/70  Pulse: 77 69  Resp: 16 14  Temp:    SpO2: 94% 95%    Last Pain:  Vitals:   11/15/19 1443  PainSc: 0-No pain                 Barnet Glasgow

## 2019-11-15 NOTE — Anesthesia Procedure Notes (Signed)
Procedure Name: Intubation Date/Time: 11/15/2019 12:50 PM Performed by: Eligha Bridegroom, CRNA Pre-anesthesia Checklist: Patient identified, Emergency Drugs available, Patient being monitored, Timeout performed and Suction available Patient Re-evaluated:Patient Re-evaluated prior to induction Oxygen Delivery Method: Circle system utilized Preoxygenation: Pre-oxygenation with 100% oxygen Induction Type: IV induction Laryngoscope Size: Mac and 4 Grade View: Grade II Tube type: Oral Tube size: 7.0 mm Number of attempts: 1 Airway Equipment and Method: Stylet Placement Confirmation: ETT inserted through vocal cords under direct vision,  positive ETCO2 and breath sounds checked- equal and bilateral Secured at: 22 cm Tube secured with: Tape Dental Injury: Teeth and Oropharynx as per pre-operative assessment

## 2019-11-15 NOTE — H&P (Signed)
[facility] ([facility]) Consult   Patient ID: BREAWNA MONTENEGRO MRN: [id number] DOB/AGE: 69/01/1951 69 y.o.  Reason for Surgery: Right elbow fracture/dislocation  HPI: Theresa Villarreal is an 69 y.o. female who presents for surgery on her right elbow.  She had a fall where she landed on her elbow.  She had an elbow dislocation and fracture that was subsequently reduced in the emergency room.  It showed a radial head fracture along with a coronoid fracture and subluxation of the ulnohumeral joint.  She is right-hand dominant.  She has no major medical history.  She lives with her sister.  She presented to my office at which point we discussed surgical versus nonsurgical intervention.  We decided on proceeding with open reduction radial head replacement with a repair of lateral ligament.  No changes since I saw her in clinic earlier this week.  Past Medical History:  Diagnosis Date  . Known health problems: none   . Medical history non-contributory     Past Surgical History:  Procedure Laterality Date  . ABDOMINAL HYSTERECTOMY  2006  . BREAST CYST EXCISION     right breast/benign  . COLONOSCOPY    . TUBAL LIGATION  1998    Family History  Problem Relation Age of Onset  . Hypertension Mother   . Multiple sclerosis Mother   . Hypertension Father   . Bone cancer Maternal Grandmother     Social History:  reports that she has never smoked. She has never used smokeless tobacco. She reports that she does not drink alcohol and does not use drugs.  Allergies: No Known Allergies  Medications:  No current facility-administered medications on file prior to encounter.   Current Outpatient Medications on File Prior to Encounter  Medication Sig Dispense Refill  . Calcium Carb-Cholecalciferol (CALCIUM + D3 PO) Take 1 tablet by mouth daily.    Marland Kitchen HYDROcodone-acetaminophen (NORCO/VICODIN) 5-325 MG tablet Take 1 tablet by mouth every 8 (eight) hours as needed for moderate pain.    Marland Kitchen  latanoprost (XALATAN) 0.005 % ophthalmic solution Place 1 drop into both eyes at bedtime.       ROS: Constitutional: No fever or chills Vision: No changes in vision ENT: No difficulty swallowing CV: No chest pain Pulm: No SOB or wheezing GI: No nausea or vomiting GU: No urgency or inability to hold urine Skin: No poor wound healing Neurologic: No numbness or tingling Psychiatric: No depression or anxiety Heme: No bruising Allergic: No reaction to medications or food   Exam: Blood pressure (!) 142/64, pulse 77, temperature 97.9 F (36.6 C), resp. rate 18, height 5\' 9"  (1.753 m), weight 108.9 kg, SpO2 98 %. General: No acute distress Orientation: Awake alert and oriented x3 Mood and Affect: Cooperative and pleasant Gait: Within normal limits Coordination and balance: Within normal limits  Right upper extremity: Splint is in place is clean dry and intact.  She has active motor and sensory function to ulnar nerve, radial nerve distribution.  She has brisk cap refill with 2+ radial pulses.  Unable to tolerate any elbow range of motion.  Splint was not taken down.  Left upper extremity: Skin without lesions. No tenderness to palpation. Full painless ROM, full strength in each muscle groups without evidence of instability.   Medical Decision Making: Data: Imaging: X-rays were reviewed which shows a comminuted radial head fracture with a small coronoid fracture with persistent elbow subluxation.  Labs: No results found for this or any previous visit (from the past 24  hour(s)).  Imaging or Labs ordered: None  Medical history and chart was reviewed and case discussed with medical provider.  Assessment/Plan: 69 year old female right-hand-dominant with a right elbow fracture dislocation.  I discussed risks and benefits of proceeding with a surgical intervention.  We will plan for radial head replacement with a lateral ligament repair plus or minus repair of the coronoid.  Risks  included but not limited to bleeding, infection, stiffness, instability of the elbow, nerve and blood vessel injury, DVT, even the possibility anesthetic complications.  She agreed to proceed with surgery and consent was obtained.  Plan for discharge home postoperatively.  Shona Needles, MD [facility] [phone] (office) [email or web address]

## 2019-11-18 ENCOUNTER — Encounter (HOSPITAL_COMMUNITY): Payer: Self-pay | Admitting: Student

## 2019-11-18 NOTE — Addendum Note (Signed)
Addendum  created 11/18/19 1559 by Barnet Glasgow, MD   Intraprocedure Staff edited

## 2020-04-09 ENCOUNTER — Encounter: Payer: Self-pay | Admitting: Gastroenterology

## 2020-07-10 ENCOUNTER — Other Ambulatory Visit: Payer: Self-pay

## 2020-07-10 ENCOUNTER — Encounter: Payer: Medicare PPO | Admitting: Gastroenterology

## 2020-07-10 ENCOUNTER — Ambulatory Visit (AMBULATORY_SURGERY_CENTER): Payer: Medicare HMO | Admitting: *Deleted

## 2020-07-10 VITALS — Ht 69.0 in | Wt 230.0 lb

## 2020-07-10 DIAGNOSIS — Z86010 Personal history of colonic polyps: Secondary | ICD-10-CM

## 2020-07-10 MED ORDER — SUPREP BOWEL PREP KIT 17.5-3.13-1.6 GM/177ML PO SOLN: 1.0000 | Freq: Once | ORAL | 0 refills | Status: AC

## 2020-07-10 NOTE — Progress Notes (Signed)

## 2020-07-23 ENCOUNTER — Encounter: Payer: Self-pay | Admitting: Gastroenterology

## 2020-07-23 ENCOUNTER — Ambulatory Visit (AMBULATORY_SURGERY_CENTER): Payer: Medicare HMO | Admitting: Gastroenterology

## 2020-07-23 ENCOUNTER — Other Ambulatory Visit: Payer: Self-pay

## 2020-07-23 VITALS — BP 136/70 | HR 61 | Temp 97.8°F | Resp 13 | Ht 69.0 in | Wt 230.0 lb

## 2020-07-23 DIAGNOSIS — D123 Benign neoplasm of transverse colon: Secondary | ICD-10-CM

## 2020-07-23 DIAGNOSIS — Z86010 Personal history of colonic polyps: Secondary | ICD-10-CM | POA: Diagnosis not present

## 2020-07-23 MED ORDER — SODIUM CHLORIDE 0.9 % IV SOLN: 500.0000 mL | Freq: Once | INTRAVENOUS | Status: DC

## 2020-07-23 NOTE — Op Note (Signed)
[facility] Patient Name: Theresa Villarreal Procedure Date: 07/23/2020 1:26 PM MRN: [id number] Endoscopist: Remo Lipps P. Havery Moros , MD Age: 70 Referring MD:  Date of Birth: 1950/06/09 Gender: Female Account #: [id number] Procedure:                Colonoscopy Indications:              High risk colon cancer surveillance: Personal                            history of colonic polyps (advanced adenoma removed                            in 10/2016) Medicines:                Monitored Anesthesia Care Procedure:                Pre-Anesthesia Assessment:                           - Prior to the procedure, a History and Physical                            was performed, and patient medications and                            allergies were reviewed. The patient's tolerance of                            previous anesthesia was also reviewed. The risks                            and benefits of the procedure and the sedation                            options and risks were discussed with the patient.                            All questions were answered, and informed consent                            was obtained. Prior Anticoagulants: The patient has                            taken no previous anticoagulant or antiplatelet                            agents. ASA Grade Assessment: III - A patient with                            severe systemic disease. After reviewing the risks                            and benefits, the patient was deemed in  satisfactory condition to undergo the procedure.                           After obtaining informed consent, the colonoscope                            was passed under direct vision. Throughout the                            procedure, the patient's blood pressure, pulse, and                            oxygen saturations were monitored continuously. The                            Olympus PCF-H190DL (#[id number]) Colonoscope  was                            introduced through the anus and advanced to the the                            cecum, identified by appendiceal orifice and                            ileocecal valve. The colonoscopy was performed                            without difficulty. The patient tolerated the                            procedure well. The quality of the bowel                            preparation was good. The ileocecal valve,                            appendiceal orifice, and rectum were photographed. Scope In: 1:30:39 PM Scope Out: 1:45:55 PM Scope Withdrawal Time: 0 hours 12 minutes 33 seconds  Total Procedure Duration: 0 hours 15 minutes 16 seconds  Findings:                 The perianal and digital rectal examinations were                            normal.                           A 4 mm polyp was found in the transverse colon. The                            polyp was sessile. The polyp was removed with a                            cold snare. Resection and retrieval were complete.  Anal papilla(e) were hypertrophied.                           Internal hemorrhoids were found during retroflexion.                           The exam was otherwise without abnormality. Complications:            No immediate complications. Estimated blood loss:                            Minimal. Estimated Blood Loss:     Estimated blood loss was minimal. Impression:               - One 4 mm polyp in the transverse colon, removed                            with a cold snare. Resected and retrieved.                           - Anal papilla(e) were hypertrophied.                           - Internal hemorrhoids.                           - The examination was otherwise normal. Recommendation:           - Patient has a contact number available for                            emergencies. The signs and symptoms of potential                            delayed complications  were discussed with the                            patient. Return to normal activities tomorrow.                            Written discharge instructions were provided to the                            patient.                           - Resume previous diet.                           - Continue present medications.                           - Await pathology results. Anticipate repeat                            colonoscopy in 5 years given history of advanced  adenoma on the last exam Carlota Raspberry. Tammatha Cobb, MD 07/23/2020 1:49:35 PM This report has been signed electronically.

## 2020-07-23 NOTE — Patient Instructions (Signed)
Discharge instructions given. ?Handouts on polyps and Hemorrhoids. ?Resume previous medications. ?YOU HAD AN ENDOSCOPIC PROCEDURE TODAY AT THE [facility]:   Refer to the procedure report that was given to you for any specific questions about what was found during the examination.  If the procedure report does not answer your questions, please call your gastroenterologist to clarify.  If you requested that your care partner not be given the details of your procedure findings, then the procedure report has been included in a sealed envelope for you to review at your convenience later. ? ?YOU SHOULD EXPECT: Some feelings of bloating in the abdomen. Passage of more gas than usual.  Walking can help get rid of the air that was put into your GI tract during the procedure and reduce the bloating. If you had a lower endoscopy (such as a colonoscopy or flexible sigmoidoscopy) you may notice spotting of blood in your stool or on the toilet paper. If you underwent a bowel prep for your procedure, you may not have a normal bowel movement for a few days. ? ?Please Note:  You might notice some irritation and congestion in your nose or some drainage.  This is from the oxygen used during your procedure.  There is no need for concern and it should clear up in a day or so. ? ?SYMPTOMS TO REPORT IMMEDIATELY: ? ?Following lower endoscopy (colonoscopy or flexible sigmoidoscopy): ? Excessive amounts of blood in the stool ? Significant tenderness or worsening of abdominal pains ? Swelling of the abdomen that is new, acute ? Fever of 100?F or higher ? ? ?For urgent or emergent issues, a gastroenterologist can be reached at any hour by calling [phone]. ?Do not use MyChart messaging for urgent concerns.  ? ? ?DIET:  We do recommend a small meal at first, but then you may proceed to your regular diet.  Drink plenty of fluids but you should avoid alcoholic beverages for 24 hours. ? ?ACTIVITY:  You should plan to take it  easy for the rest of today and you should NOT DRIVE or use heavy machinery until tomorrow (because of the sedation medicines used during the test).   ? ?FOLLOW UP: ?Our staff will call the number listed on your records 48-72 hours following your procedure to check on you and address any questions or concerns that you may have regarding the information given to you following your procedure. If we do not reach you, we will leave a message.  We will attempt to reach you two times.  During this call, we will ask if you have developed any symptoms of COVID 19. If you develop any symptoms (ie: fever, flu-like symptoms, shortness of breath, cough etc.) before then, please call [phone].  If you test positive for Covid 19 in the 2 weeks post procedure, please call and report this information to us.   ? ?If any biopsies were taken you will be contacted by phone or by letter within the next 1-3 weeks.  Please call us at [phone] if you have not heard about the biopsies in 3 weeks.  ? ? ?SIGNATURES/CONFIDENTIALITY: ?You and/or your care partner have signed paperwork which will be entered into your electronic medical record.  These signatures attest to the fact that that the information above on your After Visit Summary has been reviewed and is understood.  Full responsibility of the confidentiality of this discharge information lies with you and/or your care-partner.  ?

## 2020-07-23 NOTE — Progress Notes (Signed)
Called to room to assist during endoscopic procedure.  Patient ID and intended procedure confirmed with present staff. Received instructions for my participation in the procedure from the performing physician.  

## 2020-07-23 NOTE — Progress Notes (Signed)
A/ox3, pleased with MAC, report to RN 

## 2020-07-28 ENCOUNTER — Telehealth: Payer: Self-pay

## 2020-07-28 NOTE — Telephone Encounter (Signed)
  Follow up Call-  Call back number 07/23/2020  Post procedure Call Back phone  # [phone]  Permission to leave phone message Yes  Some recent data might be hidden     Patient questions:  Do you have a fever, pain , or abdominal swelling? No. Pain Score  0 *  Have you tolerated food without any problems? Yes.    Have you been able to return to your normal activities? Yes.    Do you have any questions about your discharge instructions: Diet   No. Medications  No. Follow up visit  No.  Do you have questions or concerns about your Care? No.  Actions: * If pain score is 4 or above: No action needed, pain <4.  1. Have you developed a fever since your procedure? no  2.   Have you had an respiratory symptoms (SOB or cough) since your procedure? no  3.   Have you tested positive for COVID 19 since your procedure no  4.   Have you had any family members/close contacts diagnosed with the COVID 19 since your procedure?  no   If yes to any of these questions please route to Joylene John, RN and Joella Prince, RN

## 2020-08-06 ENCOUNTER — Encounter: Payer: Self-pay | Admitting: Gastroenterology

## 2021-01-07 IMAGING — RF DG ELBOW COMPLETE 3+V*R*
1 series · 4 of 4 positions shown · non-contrast
Comparison: None.

CLINICAL DATA: ORIF right elbow

EXAM:
RIGHT ELBOW - COMPLETE 3+ VIEW

[Series 1: run · 4 of 4 slices shown]
[im 1/4]
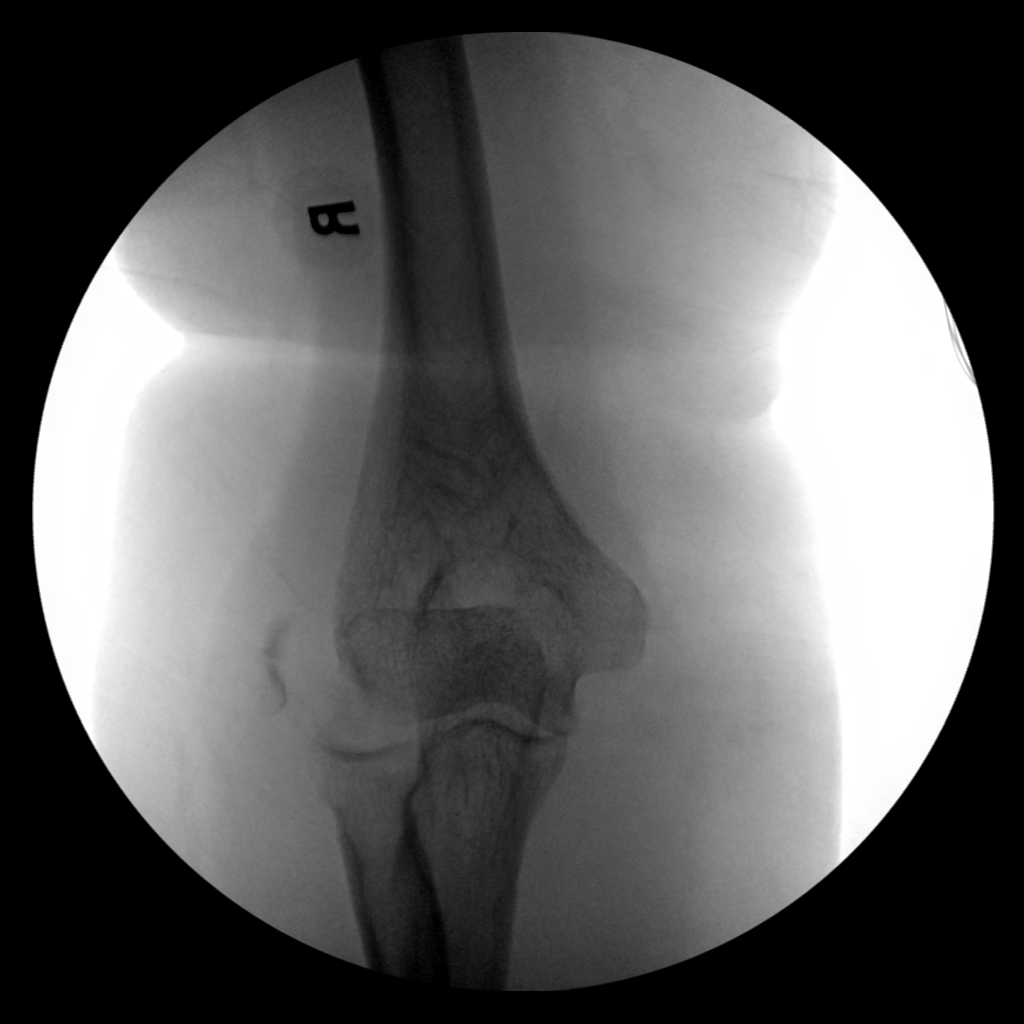
[im 2/4]
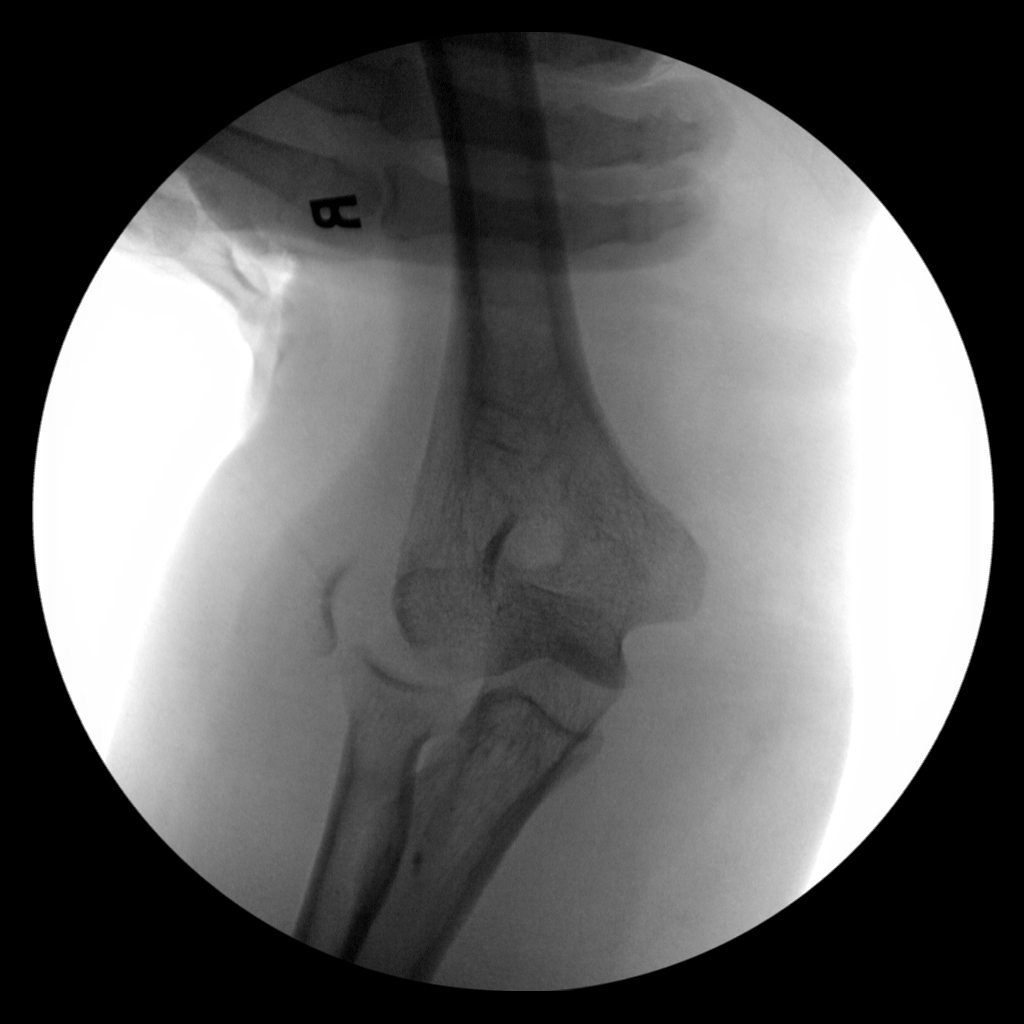
[im 3/4]
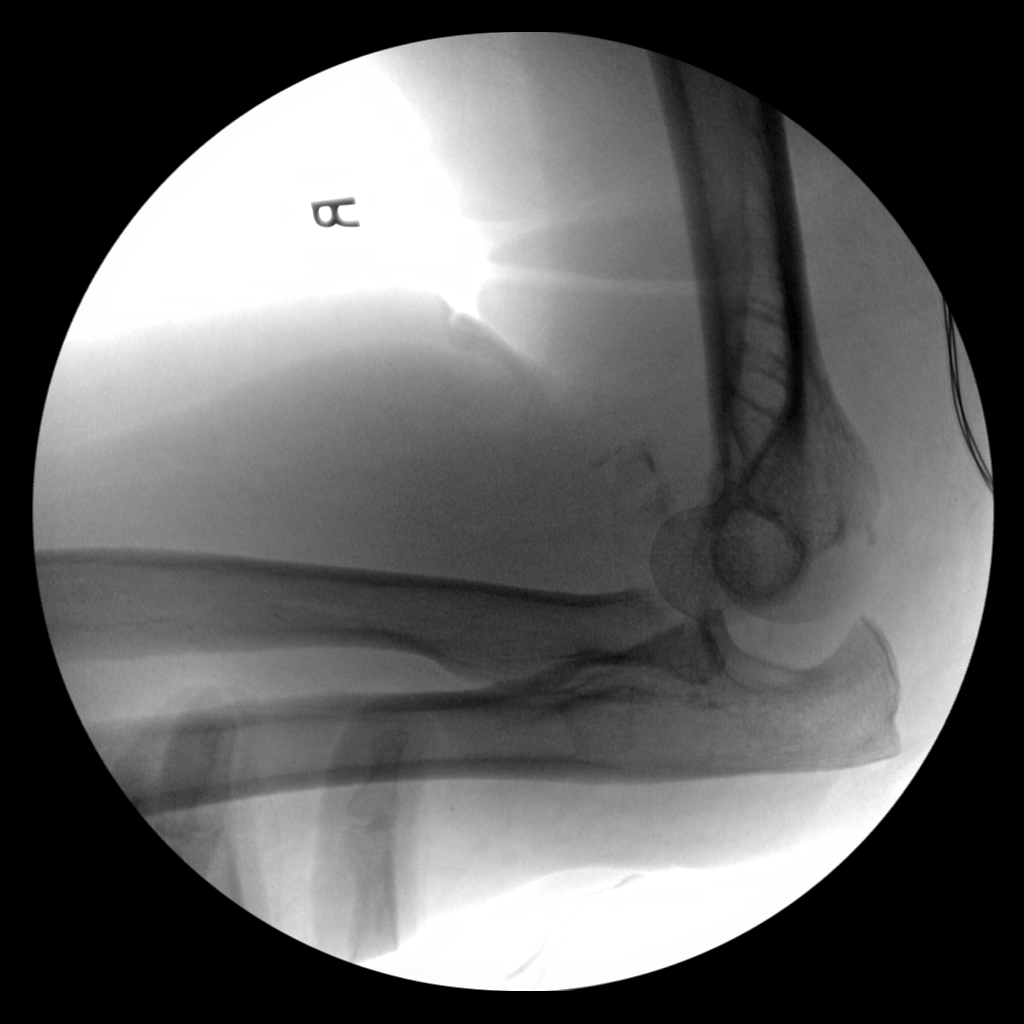
[im 4/4]
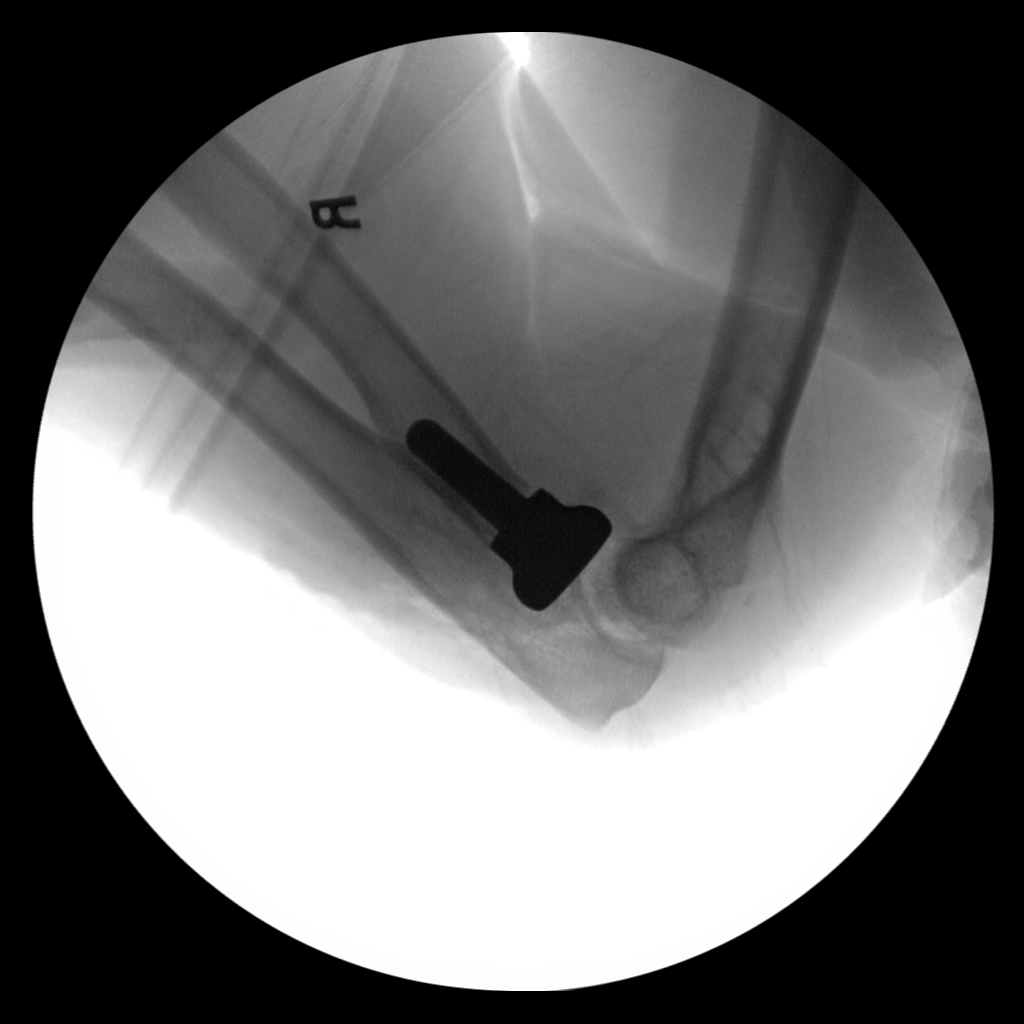

[4 of 4 positions shown; findings below may reference images not displayed]

FINDINGS: Four intraop views were submitted for review of ORIF radial
fracture. Small fracture fragments are seen adjacent to the lateral
epicondyle. Fluoro time 48 seconds
IMPRESSION: Status post ORIF radial head fracture.

## 2021-08-13 ENCOUNTER — Other Ambulatory Visit: Payer: Self-pay | Admitting: Registered Nurse

## 2021-08-13 DIAGNOSIS — M8580 Other specified disorders of bone density and structure, unspecified site: Secondary | ICD-10-CM

## 2024-02-06 ENCOUNTER — Inpatient Hospital Stay (HOSPITAL_COMMUNITY)

## 2024-02-06 ENCOUNTER — Emergency Department (HOSPITAL_COMMUNITY)

## 2024-02-06 ENCOUNTER — Encounter (HOSPITAL_COMMUNITY): Payer: Self-pay

## 2024-02-06 ENCOUNTER — Other Ambulatory Visit: Payer: Self-pay

## 2024-02-06 ENCOUNTER — Emergency Department (HOSPITAL_COMMUNITY): Admitting: Anesthesiology

## 2024-02-06 ENCOUNTER — Encounter (HOSPITAL_COMMUNITY): Admission: EM | Disposition: A | Discharge status: Skilled Nursing Facility | Payer: Self-pay | Source: Home / Self Care

## 2024-02-06 DIAGNOSIS — D696 Thrombocytopenia, unspecified: Secondary | ICD-10-CM | POA: Diagnosis present

## 2024-02-06 DIAGNOSIS — R059 Cough, unspecified: Secondary | ICD-10-CM | POA: Diagnosis not present

## 2024-02-06 DIAGNOSIS — N9489 Other specified conditions associated with female genital organs and menstrual cycle: Secondary | ICD-10-CM | POA: Diagnosis present

## 2024-02-06 DIAGNOSIS — R578 Other shock: Secondary | ICD-10-CM

## 2024-02-06 DIAGNOSIS — S2242XA Multiple fractures of ribs, left side, initial encounter for closed fracture: Secondary | ICD-10-CM | POA: Diagnosis present

## 2024-02-06 DIAGNOSIS — K567 Ileus, unspecified: Secondary | ICD-10-CM | POA: Diagnosis not present

## 2024-02-06 DIAGNOSIS — S32119A Unspecified Zone I fracture of sacrum, initial encounter for closed fracture: Secondary | ICD-10-CM | POA: Diagnosis present

## 2024-02-06 DIAGNOSIS — R45851 Suicidal ideations: Secondary | ICD-10-CM | POA: Diagnosis present

## 2024-02-06 DIAGNOSIS — S27809A Unspecified injury of diaphragm, initial encounter: Secondary | ICD-10-CM | POA: Diagnosis present

## 2024-02-06 DIAGNOSIS — N3289 Other specified disorders of bladder: Secondary | ICD-10-CM | POA: Diagnosis present

## 2024-02-06 DIAGNOSIS — S32592A Other specified fracture of left pubis, initial encounter for closed fracture: Secondary | ICD-10-CM | POA: Diagnosis present

## 2024-02-06 DIAGNOSIS — J9601 Acute respiratory failure with hypoxia: Secondary | ICD-10-CM | POA: Diagnosis present

## 2024-02-06 DIAGNOSIS — N179 Acute kidney failure, unspecified: Secondary | ICD-10-CM | POA: Diagnosis not present

## 2024-02-06 DIAGNOSIS — M8580 Other specified disorders of bone density and structure, unspecified site: Secondary | ICD-10-CM | POA: Diagnosis present

## 2024-02-06 DIAGNOSIS — I82451 Acute embolism and thrombosis of right peroneal vein: Secondary | ICD-10-CM | POA: Diagnosis not present

## 2024-02-06 DIAGNOSIS — T794XXA Traumatic shock, initial encounter: Secondary | ICD-10-CM | POA: Diagnosis present

## 2024-02-06 DIAGNOSIS — I824Z1 Acute embolism and thrombosis of unspecified deep veins of right distal lower extremity: Secondary | ICD-10-CM

## 2024-02-06 DIAGNOSIS — S271XXA Traumatic hemothorax, initial encounter: Secondary | ICD-10-CM | POA: Diagnosis present

## 2024-02-06 DIAGNOSIS — F419 Anxiety disorder, unspecified: Secondary | ICD-10-CM | POA: Diagnosis present

## 2024-02-06 DIAGNOSIS — R319 Hematuria, unspecified: Secondary | ICD-10-CM | POA: Diagnosis not present

## 2024-02-06 DIAGNOSIS — S3729XA Other injury of bladder, initial encounter: Secondary | ICD-10-CM | POA: Diagnosis present

## 2024-02-06 DIAGNOSIS — D62 Acute posthemorrhagic anemia: Secondary | ICD-10-CM | POA: Diagnosis present

## 2024-02-06 DIAGNOSIS — F430 Acute stress reaction: Secondary | ICD-10-CM | POA: Diagnosis not present

## 2024-02-06 DIAGNOSIS — K660 Peritoneal adhesions (postprocedural) (postinfection): Secondary | ICD-10-CM | POA: Diagnosis not present

## 2024-02-06 DIAGNOSIS — R609 Edema, unspecified: Secondary | ICD-10-CM | POA: Diagnosis not present

## 2024-02-06 DIAGNOSIS — R000 Tachycardia, unspecified: Secondary | ICD-10-CM | POA: Diagnosis present

## 2024-02-06 DIAGNOSIS — K565 Intestinal adhesions [bands], unspecified as to partial versus complete obstruction: Secondary | ICD-10-CM | POA: Diagnosis not present

## 2024-02-06 DIAGNOSIS — H409 Unspecified glaucoma: Secondary | ICD-10-CM | POA: Diagnosis present

## 2024-02-06 DIAGNOSIS — Z23 Encounter for immunization: Secondary | ICD-10-CM | POA: Diagnosis present

## 2024-02-06 DIAGNOSIS — E870 Hyperosmolality and hypernatremia: Secondary | ICD-10-CM | POA: Diagnosis present

## 2024-02-06 DIAGNOSIS — J90 Pleural effusion, not elsewhere classified: Secondary | ICD-10-CM | POA: Diagnosis not present

## 2024-02-06 DIAGNOSIS — J942 Hemothorax: Secondary | ICD-10-CM

## 2024-02-06 DIAGNOSIS — S32811A Multiple fractures of pelvis with unstable disruption of pelvic ring, initial encounter for closed fracture: Secondary | ICD-10-CM | POA: Diagnosis not present

## 2024-02-06 DIAGNOSIS — S3600XA Unspecified injury of spleen, initial encounter: Secondary | ICD-10-CM | POA: Diagnosis not present

## 2024-02-06 DIAGNOSIS — F515 Nightmare disorder: Secondary | ICD-10-CM | POA: Diagnosis present

## 2024-02-06 DIAGNOSIS — N189 Chronic kidney disease, unspecified: Secondary | ICD-10-CM | POA: Diagnosis present

## 2024-02-06 DIAGNOSIS — E875 Hyperkalemia: Secondary | ICD-10-CM | POA: Diagnosis present

## 2024-02-06 DIAGNOSIS — M7989 Other specified soft tissue disorders: Secondary | ICD-10-CM | POA: Diagnosis not present

## 2024-02-06 DIAGNOSIS — S36032A Major laceration of spleen, initial encounter: Secondary | ICD-10-CM | POA: Diagnosis present

## 2024-02-06 DIAGNOSIS — J1569 Pneumonia due to other gram-negative bacteria: Secondary | ICD-10-CM | POA: Diagnosis not present

## 2024-02-06 DIAGNOSIS — Z8673 Personal history of transient ischemic attack (TIA), and cerebral infarction without residual deficits: Secondary | ICD-10-CM

## 2024-02-06 DIAGNOSIS — Z79899 Other long term (current) drug therapy: Secondary | ICD-10-CM | POA: Diagnosis not present

## 2024-02-06 DIAGNOSIS — Y92410 Unspecified street and highway as the place of occurrence of the external cause: Secondary | ICD-10-CM | POA: Diagnosis not present

## 2024-02-06 DIAGNOSIS — D72829 Elevated white blood cell count, unspecified: Secondary | ICD-10-CM | POA: Diagnosis not present

## 2024-02-06 DIAGNOSIS — K449 Diaphragmatic hernia without obstruction or gangrene: Secondary | ICD-10-CM | POA: Diagnosis present

## 2024-02-06 DIAGNOSIS — T1490XA Injury, unspecified, initial encounter: Secondary | ICD-10-CM | POA: Diagnosis present

## 2024-02-06 HISTORY — DX: Other specified disorders of bone density and structure, unspecified site: M85.80

## 2024-02-06 HISTORY — PX: LAPAROTOMY: SHX154

## 2024-02-06 HISTORY — DX: Unspecified glaucoma: H40.9

## 2024-02-06 HISTORY — PX: SPLENECTOMY, TOTAL: SHX788

## 2024-02-06 HISTORY — DX: Unspecified cataract: H26.9

## 2024-02-06 LAB — POCT I-STAT 7, (LYTES, BLD GAS, ICA,H+H)
Acid-base deficit: 5 mmol/L — ABNORMAL HIGH (ref 0.0–2.0)
Bicarbonate: 21.4 mmol/L (ref 20.0–28.0)
Calcium, Ion: 1.18 mmol/L (ref 1.15–1.40)
HCT: 31 % — ABNORMAL LOW (ref 36.0–46.0)
Hemoglobin: 10.5 g/dL — ABNORMAL LOW (ref 12.0–15.0)
O2 Saturation: 100 %
Patient temperature: 35.1
Potassium: 3.8 mmol/L (ref 3.5–5.1)
Sodium: 141 mmol/L (ref 135–145)
TCO2: 23 mmol/L (ref 22–32)
pCO2 arterial: 40.5 mmHg (ref 32–48)
pH, Arterial: 7.321 — ABNORMAL LOW (ref 7.35–7.45)
pO2, Arterial: 269 mmHg — ABNORMAL HIGH (ref 83–108)

## 2024-02-06 LAB — CBC
HCT: 30.5 % — ABNORMAL LOW (ref 36.0–46.0)
HCT: 31.4 % — ABNORMAL LOW (ref 36.0–46.0)
Hemoglobin: 9.8 g/dL — ABNORMAL LOW (ref 12.0–15.0)
Hemoglobin: 10.8 g/dL — ABNORMAL LOW (ref 12.0–15.0)
MCH: 30.5 pg (ref 26.0–34.0)
MCH: 28.1 pg (ref 26.0–34.0)
MCHC: 32.1 g/dL (ref 30.0–36.0)
MCHC: 34.4 g/dL (ref 30.0–36.0)
MCV: 95 fL (ref 80.0–100.0)
MCV: 81.8 fL (ref 80.0–100.0)
Platelets: 138 K/uL — ABNORMAL LOW (ref 150–400)
Platelets: 51 K/uL — ABNORMAL LOW (ref 150–400)
RBC: 3.21 MIL/uL — ABNORMAL LOW (ref 3.87–5.11)
RBC: 3.84 MIL/uL — ABNORMAL LOW (ref 3.87–5.11)
RDW: 14.5 % (ref 11.5–15.5)
RDW: 15.6 % — ABNORMAL HIGH (ref 11.5–15.5)
WBC: 18.4 K/uL — ABNORMAL HIGH (ref 4.0–10.5)
WBC: 14.9 K/uL — ABNORMAL HIGH (ref 4.0–10.5)
nRBC: 0 % (ref 0.0–0.2)
nRBC: 0 % (ref 0.0–0.2)

## 2024-02-06 LAB — BASIC METABOLIC PANEL WITH GFR
Anion gap: 9 (ref 5–15)
BUN: 18 mg/dL (ref 8–23)
CO2: 22 mmol/L (ref 22–32)
Calcium: 8.7 mg/dL — ABNORMAL LOW (ref 8.9–10.3)
Chloride: 110 mmol/L (ref 98–111)
Creatinine, Ser: 1.37 mg/dL — ABNORMAL HIGH (ref 0.44–1.00)
GFR, Estimated: 41 mL/min — ABNORMAL LOW
Glucose, Bld: 186 mg/dL — ABNORMAL HIGH (ref 70–99)
Potassium: 3.8 mmol/L (ref 3.5–5.1)
Sodium: 141 mmol/L (ref 135–145)

## 2024-02-06 LAB — I-STAT CHEM 8, ED
BUN: 19 mg/dL (ref 8–23)
Calcium, Ion: 0.77 mmol/L — CL (ref 1.15–1.40)
Chloride: 104 mmol/L (ref 98–111)
Creatinine, Ser: 1.8 mg/dL — ABNORMAL HIGH (ref 0.44–1.00)
Glucose, Bld: 185 mg/dL — ABNORMAL HIGH (ref 70–99)
HCT: 27 % — ABNORMAL LOW (ref 36.0–46.0)
Hemoglobin: 9.2 g/dL — ABNORMAL LOW (ref 12.0–15.0)
Potassium: 4.6 mmol/L (ref 3.5–5.1)
Sodium: 143 mmol/L (ref 135–145)
TCO2: 22 mmol/L (ref 22–32)

## 2024-02-06 LAB — COMPREHENSIVE METABOLIC PANEL WITH GFR
ALT: 42 U/L (ref 0–44)
AST: 86 U/L — ABNORMAL HIGH (ref 15–41)
Albumin: 2.5 g/dL — ABNORMAL LOW (ref 3.5–5.0)
Alkaline Phosphatase: 53 U/L (ref 38–126)
Anion gap: 9 (ref 5–15)
BUN: 16 mg/dL (ref 8–23)
CO2: 21 mmol/L — ABNORMAL LOW (ref 22–32)
Calcium: 7.4 mg/dL — ABNORMAL LOW (ref 8.9–10.3)
Chloride: 109 mmol/L (ref 98–111)
Creatinine, Ser: 1.58 mg/dL — ABNORMAL HIGH (ref 0.44–1.00)
GFR, Estimated: 34 mL/min — ABNORMAL LOW
Glucose, Bld: 188 mg/dL — ABNORMAL HIGH (ref 70–99)
Potassium: 4.8 mmol/L (ref 3.5–5.1)
Sodium: 139 mmol/L (ref 135–145)
Total Bilirubin: 0.8 mg/dL (ref 0.0–1.2)
Total Protein: 4.9 g/dL — ABNORMAL LOW (ref 6.5–8.1)

## 2024-02-06 LAB — URINALYSIS, MICROSCOPIC (REFLEX): RBC / HPF: >50 RBC/hpf (ref 0–5)

## 2024-02-06 LAB — I-STAT CG4 LACTIC ACID, ED: Lactic Acid, Venous: 2.9 mmol/L (ref 0.5–1.9)

## 2024-02-06 LAB — MRSA NEXT GEN BY PCR, NASAL: MRSA by PCR Next Gen: NOT DETECTED

## 2024-02-06 LAB — URINALYSIS, ROUTINE W REFLEX MICROSCOPIC

## 2024-02-06 LAB — GLUCOSE, CAPILLARY
Glucose-Capillary: 132 mg/dL — ABNORMAL HIGH (ref 70–99)
Glucose-Capillary: 86 mg/dL (ref 70–99)

## 2024-02-06 LAB — TRAUMA TEG PANEL
CFF Max Amplitude: 16.9 mm (ref 15–32)
Citrated Kaolin (R): 5.5 min (ref 4.6–9.1)
Citrated Rapid TEG (MA): 55.3 mm (ref 52–70)
Lysis at 30 Minutes: 0 % (ref 0.0–2.6)

## 2024-02-06 LAB — PROTIME-INR
INR: 1.5 — ABNORMAL HIGH (ref 0.8–1.2)
Prothrombin Time: 19.3 s — ABNORMAL HIGH (ref 11.4–15.2)

## 2024-02-06 LAB — ETHANOL: Alcohol, Ethyl (B): <15 mg/dL

## 2024-02-06 LAB — ABO/RH: ABO/RH(D): A POS

## 2024-02-06 LAB — LACTIC ACID, PLASMA: Lactic Acid, Venous: 4.1 mmol/L (ref 0.5–1.9)

## 2024-02-06 SURGERY — LAPAROTOMY, EXPLORATORY
Anesthesia: General

## 2024-02-06 MED ORDER — SODIUM CHLORIDE 0.9 % IV BOLUS
1000.0000 mL | Freq: Once | INTRAVENOUS | Status: AC
  Administered 2024-02-06: 1000 mL via INTRAVENOUS

## 2024-02-06 MED ORDER — DOCUSATE SODIUM 50 MG/5ML PO LIQD
100.0000 mg | Freq: Two times a day (BID) | ORAL | Status: DC
  Administered 2024-02-06: 100 mg

## 2024-02-06 MED ORDER — HYDRALAZINE HCL 20 MG/ML IJ SOLN: 10.0000 mg | INTRAMUSCULAR | Status: DC | PRN

## 2024-02-06 MED ORDER — ONDANSETRON HCL 4 MG/2ML IJ SOLN
4.0000 mg | Freq: Four times a day (QID) | INTRAMUSCULAR | Status: DC | PRN
  Administered 2024-02-07 – 2024-02-15 (×6): 4 mg via INTRAVENOUS

## 2024-02-06 MED ORDER — FENTANYL 2500MCG IN NS 250ML (10MCG/ML) PREMIX INFUSION
0.0000 ug/h | INTRAVENOUS | Status: DC
  Administered 2024-02-06: 50 ug/h via INTRAVENOUS

## 2024-02-06 MED ORDER — FENTANYL CITRATE (PF) 50 MCG/ML IJ SOSY
50.0000 ug | PREFILLED_SYRINGE | INTRAMUSCULAR | Status: DC | PRN
  Administered 2024-02-08 (×3): 50 ug via INTRAVENOUS

## 2024-02-06 MED ORDER — CHLORHEXIDINE GLUCONATE CLOTH 2 % EX PADS
6.0000 | MEDICATED_PAD | Freq: Every day | CUTANEOUS | Status: DC
  Administered 2024-02-06 – 2024-02-25 (×20): 6 via TOPICAL

## 2024-02-06 MED ORDER — ORAL CARE MOUTH RINSE: 15.0000 mL | OROMUCOSAL | Status: DC | PRN

## 2024-02-06 MED ORDER — FENTANYL CITRATE (PF) 50 MCG/ML IJ SOSY: 25.0000 ug | PREFILLED_SYRINGE | Freq: Once | INTRAMUSCULAR | Status: DC

## 2024-02-06 MED ORDER — METOPROLOL TARTRATE 5 MG/5ML IV SOLN: 5.0000 mg | Freq: Four times a day (QID) | INTRAVENOUS | Status: DC | PRN

## 2024-02-06 MED ORDER — PANTOPRAZOLE SODIUM 40 MG IV SOLR
40.0000 mg | Freq: Every day | INTRAVENOUS | Status: DC
  Administered 2024-02-06: 40 mg via INTRAVENOUS

## 2024-02-06 MED ORDER — TRANEXAMIC ACID 1000 MG/10ML IV SOLN: 1000.0000 mg | Freq: Once | INTRAVENOUS | Status: DC

## 2024-02-06 MED ORDER — SODIUM CHLORIDE 0.9% FLUSH
10.0000 mL | Freq: Two times a day (BID) | INTRAVENOUS | Status: DC
  Administered 2024-02-06: 10 mL
  Administered 2024-02-07: 20 mL

## 2024-02-06 MED ORDER — ONDANSETRON 4 MG PO TBDP
4.0000 mg | ORAL_TABLET | Freq: Four times a day (QID) | ORAL | Status: DC | PRN
  Administered 2024-02-19: 4 mg via ORAL

## 2024-02-06 MED ORDER — CALCIUM GLUCONATE-NACL 2-0.675 GM/100ML-% IV SOLN
2.0000 g | Freq: Once | INTRAVENOUS | Status: AC
  Administered 2024-02-06: 2000 mg via INTRAVENOUS

## 2024-02-06 MED ORDER — CEFAZOLIN SODIUM-DEXTROSE 2-4 GM/100ML-% IV SOLN
2.0000 g | Freq: Once | INTRAVENOUS | Status: AC
  Administered 2024-02-06: 2 g via INTRAVENOUS

## 2024-02-06 MED ORDER — PROPOFOL 1000 MG/100ML IV EMUL
0.0000 ug/kg/min | INTRAVENOUS | Status: DC
  Administered 2024-02-06: 25 ug/kg/min via INTRAVENOUS
  Administered 2024-02-06: 30 ug/kg/min via INTRAVENOUS
  Administered 2024-02-07: 25 ug/kg/min via INTRAVENOUS

## 2024-02-06 MED ADMIN — henylephrine-NaCl Pref Syr 0.8 MG/10ML-0.9% (80 MCG/ML): 80 ug | INTRAVENOUS | NDC 65302050510

## 2024-02-06 MED ADMIN — henylephrine-NaCl Pref Syr 0.8 MG/10ML-0.9% (80 MCG/ML): 160 ug | INTRAVENOUS | NDC 65302050510

## 2024-02-06 MED ADMIN — Iohexol IV Soln 350 MG/ML: 75 mL | INTRAVENOUS | NDC 00407141490

## 2024-02-06 MED ADMIN — Phenylephrine-NaCl IV Solution 20 MG/250ML-0.9%: 100 ug/min | INTRAVENOUS | NDC 70004080840

## 2024-02-06 MED ADMIN — Acetaminophen Tab 500 MG: 1000 mg | NDC 50580045711

## 2024-02-06 MED ADMIN — Etomidate IV Soln 2 MG/ML: 20 mg | INTRAVENOUS | NDC 55150022110

## 2024-02-06 MED ADMIN — ORAL CARE MOUTH RINSE: 15 mL | OROMUCOSAL | NDC 99999080097

## 2024-02-06 MED ADMIN — Succinylcholine Chloride Sol Pref Syr 200 MG/10ML (20 MG/ML): 120 mg | INTRAVENOUS | NDC 99999070035

## 2024-02-06 MED ADMIN — Midazolam HCl Inj 5 MG/5ML (Base Equivalent): 2 mg | INTRAVENOUS | NDC 00409000101

## 2024-02-06 MED ADMIN — Midazolam HCl Inj 5 MG/5ML (Base Equivalent): 2 mg | INTRAVENOUS | NDC 00641605910

## 2024-02-06 MED ADMIN — Vasopressin IV Soln 20 Unit/ML (For IV Infusion): 2 [IU] | INTRAVENOUS | NDC 43598091406

## 2024-02-06 MED ADMIN — Calcium Gluconate-NaCl IV Soln 1 GM/50ML-0.675% (20 MG/ML): 1000 mg | INTRAVENOUS | NDC 44567062001

## 2024-02-06 MED ADMIN — Epinephrine IV Soln Prefilled Syringe 1 MG/10ML (0.1 MG/ML): 10 ug | INTRAVENOUS | NDC 76329331801

## 2024-02-06 MED ADMIN — Polyethylene Glycol 3350 Oral Packet 17 GM: 17 g | NDC 00904693186

## 2024-02-06 MED ADMIN — Norepinephrine-Dextrose IV Solution 4 MG/250ML-5%: 4 ug/min | INTRAVENOUS | NDC 00338011220

## 2024-02-06 MED ADMIN — Iohexol IV Soln 350 MG/ML: 100 mL | INTRAVENOUS | NDC 00407141491

## 2024-02-06 MED ADMIN — Fentanyl Citrate PF Soln Prefilled Syringe 50 MCG/ML: 25 ug | INTRAVENOUS | NDC 63323080801

## 2024-02-06 MED ADMIN — Fentanyl Citrate Preservative Free (PF) Inj 100 MCG/2ML: 25 ug | INTRAVENOUS | NDC 72572017025

## 2024-02-06 MED ADMIN — Propofol IV Emul 500 MG/50ML (10 MG/ML): 50 ug/kg/min | INTRAVENOUS | NDC 00069023420

## 2024-02-06 MED ADMIN — Sodium Bicarbonate IV Soln 8.4%: 25 meq | INTRAVENOUS | NDC 76329335201

## 2024-02-06 MED ADMIN — Norepinephrine-Dextrose IV Solution 4 MG/250ML-5%: 10 ug/min | INTRAVENOUS | NDC 70004077240

## 2024-02-06 MED ADMIN — Rocuronium Bromide IV Soln Pref Syr 100 MG/10ML (10 MG/ML): 50 mg | INTRAVENOUS | NDC 99999070048

## 2024-02-06 MED ADMIN — Iohexol Inj 300 MG/ML: 50 mL | NDC 00407141361

## 2024-02-06 MED ADMIN — Calcium Chloride Inj 10%: 30 mg | INTRAVENOUS | NDC 76329330401

## 2024-02-06 MED ADMIN — Tranexamic Acid-Sodium Chloride IV Soln 1000 MG/100ML-0.7%: 1000 mg | INTRAVENOUS | NDC 80830232902

## 2024-02-06 MED ADMIN — Fentanyl Citrate Preservative Free (PF) Inj 250 MCG/5ML: 100 ug | INTRAVENOUS | NDC 72572017125

## 2024-02-06 MED ADMIN — Sodium Chloride Irrigation Soln 0.9%: 1000 mL | NDC 99999050048

## 2024-02-06 SURGICAL SUPPLY — 37 items
BAG COUNTER SPONGE SURGICOUNT (BAG) ×2 IMPLANT
BLADE CLIPPER SURG (BLADE) IMPLANT
CANISTER SUCTION 3000ML PPV (SUCTIONS) ×2 IMPLANT
CHLORAPREP W/TINT 26 (MISCELLANEOUS) ×2 IMPLANT
COVER SURGICAL LIGHT HANDLE (MISCELLANEOUS) ×2 IMPLANT
DRAPE LAPAROSCOPIC ABDOMINAL (DRAPES) ×2 IMPLANT
DRAPE WARM FLUID 44X44 (DRAPES) ×2 IMPLANT
DRSG OPSITE POSTOP 4X10 (GAUZE/BANDAGES/DRESSINGS) IMPLANT
DRSG OPSITE POSTOP 4X12 (GAUZE/BANDAGES/DRESSINGS) IMPLANT
DRSG OPSITE POSTOP 4X8 (GAUZE/BANDAGES/DRESSINGS) IMPLANT
ELECT BLADE 6.5 EXT (BLADE) IMPLANT
ELECT CAUTERY BLADE 6.4 (BLADE) ×2 IMPLANT
ELECTRODE REM PT RTRN 9FT ADLT (ELECTROSURGICAL) ×2 IMPLANT
GLOVE BIO SURGEON STRL SZ8 (GLOVE) ×2 IMPLANT
GLOVE BIOGEL PI IND STRL 8 (GLOVE) ×2 IMPLANT
GOWN STRL REUS W/ TWL LRG LVL3 (GOWN DISPOSABLE) ×2 IMPLANT
GOWN STRL REUS W/ TWL XL LVL3 (GOWN DISPOSABLE) ×2 IMPLANT
HANDLE SUCTION POOLE (INSTRUMENTS) ×2 IMPLANT
KIT BASIN OR (CUSTOM PROCEDURE TRAY) ×2 IMPLANT
KIT TURNOVER KIT B (KITS) ×2 IMPLANT
LIGASURE IMPACT 36 18CM CVD LR (INSTRUMENTS) IMPLANT
PACK GENERAL/GYN (CUSTOM PROCEDURE TRAY) ×2 IMPLANT
PAD ARMBOARD POSITIONER FOAM (MISCELLANEOUS) ×2 IMPLANT
SOLN 0.9% NACL POUR BTL 1000ML (IV SOLUTION) ×4 IMPLANT
SPECIMEN JAR LARGE (MISCELLANEOUS) IMPLANT
SPONGE T-LAP 18X18 ~~LOC~~+RFID (SPONGE) IMPLANT
STAPLER SKIN PROX 35W (STAPLE) ×2 IMPLANT
SUT PDS AB 1 TP1 96 (SUTURE) ×4 IMPLANT
SUT PROLENE 2 0 CT 30 (SUTURE) IMPLANT
SUT PROLENE 2 0 SH 30 (SUTURE) IMPLANT
SUT SILK 2 0 SH CR/8 (SUTURE) ×2 IMPLANT
SUT SILK 2 0 TIES 10X30 (SUTURE) ×2 IMPLANT
SUT SILK 3 0 SH CR/8 (SUTURE) ×2 IMPLANT
SUT SILK 3 0 TIES 10X30 (SUTURE) ×2 IMPLANT
TOWEL GREEN STERILE (TOWEL DISPOSABLE) ×2 IMPLANT
TRAY FOLEY MTR SLVR 16FR STAT (SET/KITS/TRAYS/PACK) IMPLANT
YANKAUER SUCT BULB TIP NO VENT (SUCTIONS) IMPLANT

## 2024-02-06 NOTE — Transfer of Care (Signed)
 Immediate Anesthesia Transfer of Care Note  Patient: Theresa Villarreal  Procedure(s) Performed: LAPAROTOMY, EXPLORATORY, REPAIR OF LEFT DIAPHRAGM, LYSIS OF ADHESIONS SPLENECTOMY  Patient Location: ICU  Anesthesia Type:General  Level of Consciousness: Patient remains intubated per anesthesia plan  Airway & Oxygen Therapy: Patient remains intubated per anesthesia plan and Patient placed on Ventilator (see vital sign flow sheet for setting)  Post-op Assessment: Report given to RN and Post -op Vital signs reviewed and stable  Post vital signs: Reviewed and stable  Last Vitals:  Vitals Value Taken Time  BP 144/84 02/06/24 18:22  Temp 97.0   Pulse 83   Resp 17 02/06/24 18:23  SpO2 100   Vitals shown include unfiled device data.  Last Pain:  Vitals:   02/06/24 1638  TempSrc:   PainSc: 10-Worst pain ever         Complications: No notable events documented.

## 2024-02-06 NOTE — Progress Notes (Addendum)
 Pharmacy Electrolyte Replacement  Recent Labs:  Recent Labs    02/06/24 1554  K 4.6  CREATININE 1.80*    Low Critical Values (K </= 2.5, Phos </= 1, Mg </= 1) Present: None  MD Contacted: n/a  Plan: Ionized calcium  0.77  Calcium  gluconate 2 g x1 recheck ionized Ca in AM per protocol   Rankin Sams, PharmD, BCPS, BCCCP Clinical Pharmacist

## 2024-02-06 NOTE — ED Notes (Signed)
 Lactic results and cm 8 results given to dr.thompson md by at
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
## 2024-02-06 NOTE — Anesthesia Procedure Notes (Signed)
 Procedure Name: Intubation Date/Time: 02/06/2024 4:57 PM  Performed by: Harrold Macintosh, CRNAPre-anesthesia Checklist: Patient identified, Emergency Drugs available, Suction available and Patient being monitored Patient Re-evaluated:Patient Re-evaluated prior to induction Oxygen Delivery Method: Circle System Utilized Preoxygenation: Pre-oxygenation with 100% oxygen Induction Type: IV induction, Rapid sequence and Cricoid Pressure applied Laryngoscope Size: Miller and 2 Grade View: Grade I Tube type: Oral Tube size: 7.5 mm Number of attempts: 1 Airway Equipment and Method: Stylet Placement Confirmation: ETT inserted through vocal cords under direct vision, positive ETCO2 and breath sounds checked- equal and bilateral Secured at: 21 cm Tube secured with: Tape Dental Injury: Teeth and Oropharynx as per pre-operative assessment  Comments: Dentures removed

## 2024-02-06 NOTE — Progress Notes (Signed)
 Per  ED physician RT to pull ET tube 3-4cm , Patient original at 24cm she is now at 21cm

## 2024-02-06 NOTE — Progress Notes (Signed)
 Orthopedic Tech Progress Note Patient Details:  Theresa Villarreal 02/28/1875 [id number] Level 1 Trauma, not needed Patient ID: Theresa Villarreal, female   DOB: 02/28/1875, 73 y.o.   MRN: [id number]  Theresa Villarreal 02/06/2024, 3:29 PM

## 2024-02-06 NOTE — Progress Notes (Signed)
 Transition of Care [facility]) - CAGE-AID Screening   Patient Details  Name: Theresa Villarreal MRN: [id number] Date of Birth: 1950-10-09  Transition of Care (TOC) CM/SW Contact:    [name], RN Phone Number: 02/06/2024, 7:18 PM    CAGE-AID Screening:    Have You Ever Felt You Ought to Cut Down on Your Drinking or Drug Use?: No Have People Annoyed You By Critizing Your Drinking Or Drug Use?: No Have You Felt Bad Or Guilty About Your Drinking Or Drug Use?: No Have You Ever Had a Drink or Used Drugs First Thing In The Morning to Steady Your Nerves or to Get Rid of a Hangover?: No CAGE-AID Score: 0  Substance Abuse Education Offered: No (denies alcohol/drug use prior to intubation today)

## 2024-02-06 NOTE — Anesthesia Postprocedure Evaluation (Signed)
 Anesthesia Post Note  Patient: Theresa Villarreal  Procedure(s) Performed: LAPAROTOMY, EXPLORATORY, REPAIR OF LEFT DIAPHRAGM, LYSIS OF ADHESIONS SPLENECTOMY     Patient location during evaluation: ICU Anesthesia Type: General Level of consciousness: sedated and patient remains intubated per anesthesia plan Pain management: pain level controlled Vital Signs Assessment: post-procedure vital signs reviewed and stable Respiratory status: patient on ventilator - see flowsheet for VS and patient remains intubated per anesthesia plan Cardiovascular status: stable Postop Assessment: no apparent nausea or vomiting Anesthetic complications: no Comments: Delayed note: pt eval on transport to ICU post op   Weaning pressors, VSS  Report to ICU staff   No notable events documented.                Laelia Angelo,E. Zerrick Hanssen

## 2024-02-06 NOTE — Procedures (Signed)
 Central line  Date/Time: 02/06/2024 4:16 PM  Performed by: Sebastian Moles, MD Authorized by: Sebastian Moles, MD   Consent:    Consent obtained:  Emergent situation Universal protocol:    Test results available: yes     Imaging studies available: yes     Required blood products, implants, devices, and special equipment available: yes     Site/side marked: yes     Immediately prior to procedure, a time out was called: yes   Pre-procedure details:    Indication(s): central venous access     Hand hygiene: Hand hygiene performed prior to insertion     Sterile barrier technique: All elements of maximal sterile technique followed     Skin preparation:  Chlorhexidine  Anesthesia:    Anesthesia method:  None Procedure details:    Location:  R femoral   Catheter size:  7 Fr   Number of attempts:  1   Successful placement: yes   Post-procedure details:    Post-procedure:  Dressing applied and line sutured   Assessment:  Blood return through all ports Moles Sebastian, MD, MPH, FACS Please use [email or web address] to contact on call provider

## 2024-02-06 NOTE — Procedures (Signed)
 Chest tube insertion  Date/Time: 02/06/2024 4:18 PM  Performed by: Sebastian Moles, MD Authorized by: Sebastian Moles, MD   Consent:    Consent obtained:  Emergent situation Pre-procedure details:    Skin preparation:  ChloraPrep Anesthesia (see MAR for exact dosages):    Anesthesia method:  None Procedure details:    Placement location:  L lateral   Tube size (Fr):  28   Technique: blunt     Dissection instrument:  Kelly clamp   Ultrasound guidance: no     Tension pneumothorax: no     Suture material:  0 silk   Dressing:  4x4 sterile gauze Comments:     Body habitus initially precluded pigtail placement. Large L hemothorax. Moles Sebastian, MD, MPH, FACS Please use [email or web address] to contact on call provider
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
## 2024-02-06 NOTE — Anesthesia Procedure Notes (Signed)
 Arterial Line Insertion Start/End12/10/2023 5:00 PM, 02/06/2024 5:05 PM Performed by: Maryclare Cornet, MD, anesthesiologist  Patient location: Pre-op. Emergency situation Lidocaine  1% used for infiltration and patient sedated Left, radial was placed Catheter size: 20 G Hand hygiene performed  and maximum sterile barriers used  Allen's test indicative of satisfactory collateral circulation Attempts: 1 Procedure performed using ultrasound to evaluate access site. Following insertion, dressing applied and Biopatch. Post procedure assessment: normal and unchanged  Patient tolerated the procedure well with no immediate complications. Additional procedure comments: 1st attempt by A. Forensic Scientist, SCIENTIST, CLINICAL (HISTOCOMPATIBILITY AND IMMUNOGENETICS).
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
## 2024-02-06 NOTE — ED Notes (Signed)
Blood bank called 

## 2024-02-06 NOTE — ED Notes (Signed)
 Patient transported to CT

## 2024-02-07 ENCOUNTER — Inpatient Hospital Stay (HOSPITAL_COMMUNITY)

## 2024-02-07 ENCOUNTER — Encounter (HOSPITAL_COMMUNITY): Admission: EM | Disposition: A | Discharge status: Skilled Nursing Facility | Payer: Self-pay | Source: Home / Self Care

## 2024-02-07 ENCOUNTER — Encounter (HOSPITAL_COMMUNITY): Payer: Self-pay

## 2024-02-07 DIAGNOSIS — S32811A Multiple fractures of pelvis with unstable disruption of pelvic ring, initial encounter for closed fracture: Secondary | ICD-10-CM

## 2024-02-07 HISTORY — PX: ORIF PELVIC FRACTURE WITH PERCUTANEOUS SCREWS: SHX6800

## 2024-02-07 LAB — POCT I-STAT 7, (LYTES, BLD GAS, ICA,H+H)
Acid-base deficit: 9 mmol/L — ABNORMAL HIGH (ref 0.0–2.0)
Acid-base deficit: 8 mmol/L — ABNORMAL HIGH (ref 0.0–2.0)
Bicarbonate: 20.1 mmol/L (ref 20.0–28.0)
Bicarbonate: 19 mmol/L — ABNORMAL LOW (ref 20.0–28.0)
Calcium, Ion: 0.93 mmol/L — ABNORMAL LOW (ref 1.15–1.40)
Calcium, Ion: 0.71 mmol/L — CL (ref 1.15–1.40)
HCT: 20 % — ABNORMAL LOW (ref 36.0–46.0)
HCT: 21 % — ABNORMAL LOW (ref 36.0–46.0)
Hemoglobin: 6.8 g/dL — CL (ref 12.0–15.0)
Hemoglobin: 7.1 g/dL — ABNORMAL LOW (ref 12.0–15.0)
O2 Saturation: 91 %
O2 Saturation: 100 %
Patient temperature: 34.8
Patient temperature: 35
Potassium: 5.1 mmol/L (ref 3.5–5.1)
Potassium: 3.8 mmol/L (ref 3.5–5.1)
Sodium: 138 mmol/L (ref 135–145)
Sodium: 141 mmol/L (ref 135–145)
TCO2: 22 mmol/L (ref 22–32)
TCO2: 20 mmol/L — ABNORMAL LOW (ref 22–32)
pCO2 arterial: 55.2 mmHg — ABNORMAL HIGH (ref 32–48)
pCO2 arterial: 41.1 mmHg (ref 32–48)
pH, Arterial: 7.156 — CL (ref 7.35–7.45)
pH, Arterial: 7.262 — ABNORMAL LOW (ref 7.35–7.45)
pO2, Arterial: 71 mmHg — ABNORMAL LOW (ref 83–108)
pO2, Arterial: 204 mmHg — ABNORMAL HIGH (ref 83–108)

## 2024-02-07 LAB — BASIC METABOLIC PANEL WITH GFR
Anion gap: 10 (ref 5–15)
BUN: 16 mg/dL (ref 8–23)
CO2: 21 mmol/L — ABNORMAL LOW (ref 22–32)
Calcium: 7.7 mg/dL — ABNORMAL LOW (ref 8.9–10.3)
Chloride: 110 mmol/L (ref 98–111)
Creatinine, Ser: 1.19 mg/dL — ABNORMAL HIGH (ref 0.44–1.00)
GFR, Estimated: 48 mL/min — ABNORMAL LOW
Glucose, Bld: 119 mg/dL — ABNORMAL HIGH (ref 70–99)
Potassium: 3.8 mmol/L (ref 3.5–5.1)
Sodium: 141 mmol/L (ref 135–145)

## 2024-02-07 LAB — CBC
HCT: 26.3 % — ABNORMAL LOW (ref 36.0–46.0)
HCT: 30.4 % — ABNORMAL LOW (ref 36.0–46.0)
Hemoglobin: 9.4 g/dL — ABNORMAL LOW (ref 12.0–15.0)
Hemoglobin: 10.3 g/dL — ABNORMAL LOW (ref 12.0–15.0)
MCH: 28.4 pg (ref 26.0–34.0)
MCH: 29 pg (ref 26.0–34.0)
MCHC: 35.7 g/dL (ref 30.0–36.0)
MCHC: 33.9 g/dL (ref 30.0–36.0)
MCV: 79.5 fL — ABNORMAL LOW (ref 80.0–100.0)
MCV: 85.6 fL (ref 80.0–100.0)
Platelets: 57 K/uL — ABNORMAL LOW (ref 150–400)
Platelets: 46 K/uL — ABNORMAL LOW (ref 150–400)
RBC: 3.31 MIL/uL — ABNORMAL LOW (ref 3.87–5.11)
RBC: 3.55 MIL/uL — ABNORMAL LOW (ref 3.87–5.11)
RDW: 16.1 % — ABNORMAL HIGH (ref 11.5–15.5)
RDW: 17.6 % — ABNORMAL HIGH (ref 11.5–15.5)
WBC: 10.6 K/uL — ABNORMAL HIGH (ref 4.0–10.5)
WBC: 15.9 K/uL — ABNORMAL HIGH (ref 4.0–10.5)
nRBC: 0 % (ref 0.0–0.2)
nRBC: 0 % (ref 0.0–0.2)

## 2024-02-07 LAB — GLUCOSE, CAPILLARY
Glucose-Capillary: 102 mg/dL — ABNORMAL HIGH (ref 70–99)
Glucose-Capillary: 110 mg/dL — ABNORMAL HIGH (ref 70–99)
Glucose-Capillary: 113 mg/dL — ABNORMAL HIGH (ref 70–99)
Glucose-Capillary: 77 mg/dL (ref 70–99)
Glucose-Capillary: 77 mg/dL (ref 70–99)

## 2024-02-07 LAB — TRIGLYCERIDES: Triglycerides: 81 mg/dL

## 2024-02-07 LAB — LACTIC ACID, PLASMA: Lactic Acid, Venous: 1.8 mmol/L (ref 0.5–1.9)

## 2024-02-07 SURGERY — CLOSED REDUCTION, PELVIS, WITH PERCUTANEOUS FIXATION
Anesthesia: General | Laterality: Left

## 2024-02-07 MED ORDER — METOPROLOL TARTRATE 5 MG/5ML IV SOLN
5.0000 mg | INTRAVENOUS | Status: DC | PRN
  Administered 2024-02-07 – 2024-02-10 (×3): 5 mg via INTRAVENOUS

## 2024-02-07 MED ORDER — ORAL CARE MOUTH RINSE: 15.0000 mL | OROMUCOSAL | Status: DC | PRN

## 2024-02-07 MED ADMIN — Sodium Chloride Irrigation Soln 0.9%: 1000 mL | NDC 99999050048

## 2024-02-07 MED ADMIN — Acetaminophen Tab 500 MG: 1000 mg | NDC 50580045711

## 2024-02-07 MED ADMIN — Cefazolin Sodium for IV Soln 2 GM and Dextrose 3% (50 ML): 2 g | INTRAVENOUS | NDC 00264310511

## 2024-02-07 MED ADMIN — Oxycodone HCl Tab 5 MG: 5 mg | NDC 68084035411

## 2024-02-07 MED ADMIN — ORAL CARE MOUTH RINSE: 15 mL | OROMUCOSAL | NDC 99999080097

## 2024-02-07 MED ADMIN — Rocuronium Bromide IV Soln Pref Syr 100 MG/10ML (10 MG/ML): 60 mg | INTRAVENOUS | NDC 99999070048

## 2024-02-07 MED ADMIN — Rocuronium Bromide IV Soln Pref Syr 100 MG/10ML (10 MG/ML): 30 mg | INTRAVENOUS | NDC 99999070048

## 2024-02-07 MED ADMIN — Fentanyl Citrate Preservative Free (PF) Inj 100 MCG/2ML: 100 ug | INTRAVENOUS | NDC 99999070038

## 2024-02-07 MED ADMIN — Cefazolin Sodium-Dextrose IV Solution 2 GM/100ML-4%: 2 g | INTRAVENOUS | NDC 00338350841

## 2024-02-07 MED ADMIN — Calcium Gluconate-NaCl IV Soln 1 GM/50ML-0.675% (20 MG/ML): 1000 mg | INTRAVENOUS | NDC 44567062001

## 2024-02-07 MED ADMIN — Hydromorphone HCl Inj 1 MG/ML: .5 mg | INTRAVENOUS | NDC 76045000901

## 2024-02-07 MED ADMIN — Calcium Chloride Inj 10%: .5 g | INTRAVENOUS | NDC 76329330401

## 2024-02-07 MED ADMIN — Oxycodone HCl Tab 5 MG: 7.5 mg | NDC 68084035411

## 2024-02-07 MED ADMIN — Dexmedetomidine HCl in NaCl 0.9% IV Soln 80 MCG/20ML: 12 ug | INTRAVENOUS | NDC 00781349395

## 2024-02-07 MED ADMIN — Docusate Sodium Liquid 150 MG/15ML: 100 mg | ORAL | NDC 00904727966

## 2024-02-07 MED ADMIN — Ondansetron HCl Inj 4 MG/2ML (2 MG/ML): 4 mg | INTRAVENOUS | NDC 60505613005

## 2024-02-07 MED ADMIN — Sugammadex Sodium IV 200 MG/2ML (Base Equivalent): 200 mg | INTRAVENOUS | NDC 00006542302

## 2024-02-07 MED ADMIN — Midazolam HCl Inj PF 2 MG/2ML (Base Equivalent): 2 mg | INTRAVENOUS | NDC 00409000125

## 2024-02-07 SURGICAL SUPPLY — 43 items
BAG COUNTER SPONGE SURGICOUNT (BAG) ×1 IMPLANT
BIT DRILL CANN 4.5MM (BIT) IMPLANT
BLADE CLIPPER SURG (BLADE) IMPLANT
BLADE SURG 11 STRL SS (BLADE) ×1 IMPLANT
CHLORAPREP W/TINT 26 (MISCELLANEOUS) ×1 IMPLANT
DERMABOND ADVANCED .7 DNX12 (GAUZE/BANDAGES/DRESSINGS) IMPLANT
DRAPE C-ARM 42X72 X-RAY (DRAPES) ×1 IMPLANT
DRAPE C-ARMOR (DRAPES) ×1 IMPLANT
DRAPE HALF SHEET 40X57 (DRAPES) ×1 IMPLANT
DRAPE INCISE IOBAN 66X45 STRL (DRAPES) ×1 IMPLANT
DRAPE SURG 17X23 STRL (DRAPES) ×6 IMPLANT
DRAPE U-SHAPE 47X51 STRL (DRAPES) ×1 IMPLANT
DRESSING MEPILEX FLEX 4X4 (GAUZE/BANDAGES/DRESSINGS) IMPLANT
DRSG MEPILEX POST OP 4X8 (GAUZE/BANDAGES/DRESSINGS) IMPLANT
DRSG OPSITE POSTOP 4X10 (GAUZE/BANDAGES/DRESSINGS) IMPLANT
DRSG OPSITE POSTOP 4X12 (GAUZE/BANDAGES/DRESSINGS) IMPLANT
DRSG TEGADERM 4X4.75 (GAUZE/BANDAGES/DRESSINGS) IMPLANT
ELECTRODE REM PT RTRN 9FT ADLT (ELECTROSURGICAL) ×1 IMPLANT
GAUZE SPONGE 2X2 STRL 8-PLY (GAUZE/BANDAGES/DRESSINGS) IMPLANT
GLOVE BIO SURGEON STRL SZ 6.5 (GLOVE) ×3 IMPLANT
GLOVE BIO SURGEON STRL SZ7.5 (GLOVE) ×4 IMPLANT
GLOVE BIOGEL PI IND STRL 6.5 (GLOVE) ×1 IMPLANT
GLOVE BIOGEL PI IND STRL 7.5 (GLOVE) ×1 IMPLANT
GOWN STRL REUS W/ TWL LRG LVL3 (GOWN DISPOSABLE) ×2 IMPLANT
GUIDEWIRE 2.0MM (WIRE) IMPLANT
GUIDEWIRE 2.8 THREAD 450 L ST (WIRE) IMPLANT
KIT BASIN OR (CUSTOM PROCEDURE TRAY) ×1 IMPLANT
KIT TURNOVER KIT B (KITS) ×1 IMPLANT
MANIFOLD NEPTUNE II (INSTRUMENTS) ×1 IMPLANT
PACK TOTAL JOINT (CUSTOM PROCEDURE TRAY) ×1 IMPLANT
PACK UNIVERSAL I (CUSTOM PROCEDURE TRAY) ×1 IMPLANT
PAD ARMBOARD POSITIONER FOAM (MISCELLANEOUS) ×2 IMPLANT
SCREW CANN FT 7.5X80 (Screw) IMPLANT
SCREW CANN HD FT 7.5X160 (Screw) IMPLANT
SOLN 0.9% NACL POUR BTL 1000ML (IV SOLUTION) ×1 IMPLANT
SOLN STERILE WATER BTL 1000 ML (IV SOLUTION) ×1 IMPLANT
SPONGE T-LAP 18X18 ~~LOC~~+RFID (SPONGE) IMPLANT
STAPLER SKIN PROX 35W (STAPLE) ×1 IMPLANT
SUCTION TUBE FRAZIER 10FR DISP (SUCTIONS) ×1 IMPLANT
SUT MNCRL AB 3-0 PS2 18 (SUTURE) ×1 IMPLANT
SUT MNCRL AB 3-0 PS2 27 (SUTURE) IMPLANT
SUT MON AB 2-0 CT1 36 (SUTURE) ×1 IMPLANT
TRAY FOLEY MTR SLVR 16FR STAT (SET/KITS/TRAYS/PACK) IMPLANT

## 2024-02-07 NOTE — Plan of Care (Addendum)
 [id number] Layer PA obtained phone consent from son for pelvis fixation and blood administration. All family questions answered by PA. [phone]:  Pt transported to OR with CRNA and OR team.   1208: Pt returned from OR.  Pt extubated and on 4L [place]. Pt VSS.  1230:  CT Pelvis completed in CT Room 1.  See results.    Problem: Education: Goal: Knowledge of General Education information will improve Description: Including pain rating scale, medication(s)/side effects and non-pharmacologic comfort measures Outcome: Progressing   Problem: Health Behavior/Discharge Planning: Goal: Ability to manage health-related needs will improve Outcome: Progressing   Problem: Clinical Measurements: Goal: Ability to maintain clinical measurements within normal limits will improve Outcome: Progressing Goal: Will remain free from infection Outcome: Progressing Goal: Diagnostic test results will improve Outcome: Progressing Goal: Respiratory complications will improve Outcome: Progressing Goal: Cardiovascular complication will be avoided Outcome: Progressing   Problem: Activity: Goal: Risk for activity intolerance will decrease Outcome: Progressing   Problem: Nutrition: Goal: Adequate nutrition will be maintained Outcome: Progressing   Problem: Coping: Goal: Level of anxiety will decrease Outcome: Progressing   Problem: Elimination: Goal: Will not experience complications related to bowel motility Outcome: Progressing Goal: Will not experience complications related to urinary retention Outcome: Progressing   Problem: Pain Managment: Goal: General experience of comfort will improve and/or be controlled Outcome: Progressing   Problem: Safety: Goal: Ability to remain free from injury will improve Outcome: Progressing   Problem: Skin Integrity: Goal: Risk for impaired skin integrity will decrease Outcome: Progressing

## 2024-02-07 NOTE — Progress Notes (Signed)
 Pt's HR 120s-130s. RR elevated 20s-30s. Pt states pain is being managed. PRN oxycodone  7.5 mg given @ 2100. HR remains elevated and RR remains elevated intermittently. No new output in chest tube collection. Chest tube dressing saturated; changed dressing to monitor output from site. Last CBC drawn 0400 this morning. TMD notified.   CBC ordered. Called lab.     Therisa JINNY Sick RN
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
## 2024-02-07 NOTE — Progress Notes (Addendum)
 Informed TMD, Metzger of hgb decrease from 10.8 to 9.4. Informed of clinically stability at this time and asked if a repeat was needed soon. TMD said repeat CBC this afternoon IF no need to check sooner (instability or obvious bleeding). Also informed of 7.7 Ca, VO to replace 1G Ca Gluconate.

## 2024-02-07 NOTE — Progress Notes (Signed)
 Theresa Villarreal, Ortho PA at bedside to assess Pt for surgery today. Phone consent for pelvis fixation obtained via phone from son [name]. Everything was explained to [place] by Theresa and all questions were answered. He verbalized consent for the procedure to this RN.
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
## 2024-02-07 NOTE — Plan of Care (Signed)
°  Problem: Education: Goal: Knowledge of General Education information will improve Description: Including pain rating scale, medication(s)/side effects and non-pharmacologic comfort measures 02/07/2024 1008 by Theresa Richerd BROCKS, RN Outcome: Progressing 02/07/2024 1007 by Theresa Richerd BROCKS, RN Outcome: Progressing 02/07/2024 0723 by Theresa Richerd BROCKS, RN Outcome: Progressing   Problem: Health Behavior/Discharge Planning: Goal: Ability to manage health-related needs will improve 02/07/2024 1008 by Theresa Richerd BROCKS, RN Outcome: Progressing 02/07/2024 1007 by Theresa Richerd BROCKS, RN Outcome: Progressing 02/07/2024 0723 by Theresa Richerd BROCKS, RN Outcome: Progressing   Problem: Clinical Measurements: Goal: Ability to maintain clinical measurements within normal limits will improve 02/07/2024 1008 by Theresa Richerd BROCKS, RN Outcome: Progressing 02/07/2024 1007 by Theresa Richerd BROCKS, RN Outcome: Progressing 02/07/2024 0723 by Theresa Richerd BROCKS, RN Outcome: Progressing Goal: Will remain free from infection 02/07/2024 1008 by Theresa Richerd BROCKS, RN Outcome: Progressing 02/07/2024 1007 by Theresa Richerd BROCKS, RN Outcome: Progressing 02/07/2024 0723 by Theresa Richerd BROCKS, RN Outcome: Progressing Goal: Diagnostic test results will improve 02/07/2024 1008 by Theresa Richerd BROCKS, RN Outcome: Progressing 02/07/2024 1007 by Theresa Richerd BROCKS, RN Outcome: Progressing 02/07/2024 0723 by Theresa Richerd BROCKS, RN Outcome: Progressing Goal: Respiratory complications will improve 02/07/2024 1008 by Theresa Richerd BROCKS, RN Outcome: Progressing 02/07/2024 1007 by Theresa Richerd BROCKS, RN Outcome: Progressing 02/07/2024 0723 by Theresa Richerd BROCKS, RN Outcome: Progressing Goal: Cardiovascular complication will be avoided 02/07/2024 1008 by Theresa Richerd BROCKS, RN Outcome: Progressing 02/07/2024 1007 by Theresa Richerd BROCKS, RN Outcome:  Progressing 02/07/2024 0723 by Theresa Richerd BROCKS, RN Outcome: Progressing   Problem: Activity: Goal: Risk for activity intolerance will decrease 02/07/2024 1008 by Theresa Richerd BROCKS, RN Outcome: Progressing 02/07/2024 1007 by Theresa Richerd BROCKS, RN Outcome: Progressing 02/07/2024 0723 by Theresa Richerd BROCKS, RN Outcome: Progressing   Problem: Nutrition: Goal: Adequate nutrition will be maintained 02/07/2024 1008 by Theresa Richerd BROCKS, RN Outcome: Progressing 02/07/2024 1007 by Theresa Richerd BROCKS, RN Outcome: Progressing 02/07/2024 0723 by Theresa Richerd BROCKS, RN Outcome: Progressing   Problem: Coping: Goal: Level of anxiety will decrease 02/07/2024 1008 by Theresa Richerd BROCKS, RN Outcome: Progressing 02/07/2024 1007 by Theresa Richerd BROCKS, RN Outcome: Progressing 02/07/2024 0723 by Theresa Richerd BROCKS, RN Outcome: Progressing   Problem: Elimination: Goal: Will not experience complications related to bowel motility 02/07/2024 1008 by Theresa Richerd BROCKS, RN Outcome: Progressing 02/07/2024 1007 by Theresa Richerd BROCKS, RN Outcome: Progressing 02/07/2024 0723 by Theresa Richerd BROCKS, RN Outcome: Progressing Goal: Will not experience complications related to urinary retention 02/07/2024 1008 by Theresa Richerd BROCKS, RN Outcome: Progressing 02/07/2024 1007 by Theresa Richerd BROCKS, RN Outcome: Progressing 02/07/2024 0723 by Theresa Richerd BROCKS, RN Outcome: Progressing   Problem: Pain Managment: Goal: General experience of comfort will improve and/or be controlled 02/07/2024 1008 by Theresa Richerd BROCKS, RN Outcome: Progressing 02/07/2024 1007 by Theresa Richerd BROCKS, RN Outcome: Progressing 02/07/2024 0723 by Theresa Richerd BROCKS, RN Outcome: Progressing   Problem: Safety: Goal: Ability to remain free from injury will improve 02/07/2024 1008 by Theresa Richerd BROCKS, RN Outcome: Progressing 02/07/2024 1007 by Theresa Richerd BROCKS, RN Outcome:  Progressing 02/07/2024 0723 by Theresa Richerd BROCKS, RN Outcome: Progressing   Problem: Skin Integrity: Goal: Risk for impaired skin integrity will decrease 02/07/2024 1008 by Theresa Richerd BROCKS, RN Outcome: Progressing 02/07/2024 1007 by Theresa Richerd BROCKS, RN Outcome: Progressing 02/07/2024 0723 by Theresa Richerd BROCKS, RN Outcome: Progressing

## 2024-02-07 NOTE — Interval H&P Note (Signed)
 History and Physical Interval Note:  02/07/2024 9:07 AM  Theresa Villarreal  has presented today for surgery, with the diagnosis of Pelvic fracture.  The various methods of treatment have been discussed with the patient and family. After consideration of risks, benefits and other options for treatment, the patient has consented to  Procedure(s): CLOSED REDUCTION, PELVIS, WITH PERCUTANEOUS FIXATION (Left) as a surgical intervention.  The patient's history has been reviewed, patient examined, no change in status, stable for surgery.  I have reviewed the patient's chart and labs.  Questions were answered to the patient's satisfaction.     [name] P [name]
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
## 2024-02-07 NOTE — Anesthesia Postprocedure Evaluation (Signed)
 Anesthesia Post Note  Patient: Theresa Villarreal  Procedure(s) Performed: CLOSED REDUCTION, PELVIS, WITH PERCUTANEOUS FIXATION (Left)     Patient location during evaluation: PACU Anesthesia Type: General Level of consciousness: awake and alert Pain management: pain level controlled Vital Signs Assessment: post-procedure vital signs reviewed and stable Respiratory status: spontaneous breathing, nonlabored ventilation, respiratory function stable and patient connected to nasal cannula oxygen Cardiovascular status: blood pressure returned to baseline and stable Postop Assessment: no apparent nausea or vomiting Anesthetic complications: no   No notable events documented.  Last Vitals:  Vitals:   02/07/24 1130 02/07/24 1200  BP: 126/70 103/69  Pulse: (!) 101 98  Resp: 11 (!) 22  Temp: 36.9 C 36.6 C  SpO2: 93% 96%    Last Pain:  Vitals:   02/07/24 1130  TempSrc:   PainSc: 0-No pain                 Excell Neyland,W. EDMOND

## 2024-02-07 NOTE — Transfer of Care (Signed)
 Immediate Anesthesia Transfer of Care Note  Patient: Theresa Villarreal  Procedure(s) Performed: CLOSED REDUCTION, PELVIS, WITH PERCUTANEOUS FIXATION (Left)  Patient Location: PACU  Anesthesia Type:General  Level of Consciousness: awake, alert , and oriented  Airway & Oxygen Therapy: Patient Spontanous Breathing and Patient connected to face mask oxygen  Post-op Assessment: Report given to RN and Post -op Vital signs reviewed and stable  Post vital signs: Reviewed and stable  Last Vitals:  Vitals Value Taken Time  BP 112/96 02/07/24 10:49  Temp    Pulse 99 02/07/24 10:51  Resp 20 02/07/24 10:51  SpO2 93 % 02/07/24 10:51  Vitals shown include unfiled device data.  Last Pain:  Vitals:   02/07/24 0701  TempSrc: Bladder  PainSc:          Complications: No notable events documented.
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
## 2024-02-07 NOTE — Anesthesia Preprocedure Evaluation (Signed)
 Anesthesia Evaluation  Patient identified by MRN, date of birth, ID band Patient unresponsive    Reviewed: Allergy & Precautions, H&P , NPO status , Patient's Chart, lab work & pertinent test results  Airway Mallampati: Intubated       Dental no notable dental hx. (+) Dental Advisory Given   Pulmonary  Intubated   Pulmonary exam normal breath sounds clear to auscultation       Cardiovascular negative cardio ROS  Rhythm:Regular Rate:Normal     Neuro/Psych negative neurological ROS  negative psych ROS   GI/Hepatic negative GI ROS, Neg liver ROS,,,  Endo/Other  negative endocrine ROS    Renal/GU negative Renal ROS  negative genitourinary   Musculoskeletal   Abdominal   Peds  Hematology negative hematology ROS (+)   Anesthesia Other Findings   Reproductive/Obstetrics negative OB ROS                              Anesthesia Physical Anesthesia Plan  ASA: 4  Anesthesia Plan: General   Post-op Pain Management:    Induction:   PONV Risk Score and Plan: 3 and Ondansetron , Dexamethasone and Treatment may vary due to age or medical condition  Airway Management Planned: Oral ETT  Additional Equipment:   Intra-op Plan:   Post-operative Plan: Extubation in OR  Informed Consent: I have reviewed the patients History and Physical, chart, labs and discussed the procedure including the risks, benefits and alternatives for the proposed anesthesia with the patient or authorized representative who has indicated his/her understanding and acceptance.     Dental advisory given  Plan Discussed with: CRNA  Anesthesia Plan Comments:          Anesthesia Quick Evaluation

## 2024-02-08 ENCOUNTER — Encounter (HOSPITAL_COMMUNITY): Payer: Self-pay | Admitting: Student

## 2024-02-08 ENCOUNTER — Inpatient Hospital Stay (HOSPITAL_COMMUNITY)

## 2024-02-08 LAB — PREPARE FRESH FROZEN PLASMA
Unit division: 0
Unit division: 0
Unit division: 0

## 2024-02-08 LAB — BPAM RBC
Blood Product Expiration Date: 202512102359
Blood Product Expiration Date: 202512102359
Blood Product Expiration Date: 202512262359
Blood Product Expiration Date: 202512282359
Blood Product Expiration Date: 202512282359
Blood Product Expiration Date: 202512292359
Blood Product Expiration Date: 202512292359
Blood Product Expiration Date: 202512292359
Blood Product Expiration Date: 202601032359
Blood Product Expiration Date: 202601032359
Blood Product Expiration Date: 202601042359
Blood Product Expiration Date: 202601042359
ISSUE DATE / TIME: 202512091528
ISSUE DATE / TIME: 202512091537
ISSUE DATE / TIME: 202512091600
ISSUE DATE / TIME: 202512091626
ISSUE DATE / TIME: 202512091708
ISSUE DATE / TIME: 202512091708
ISSUE DATE / TIME: 202512091708
ISSUE DATE / TIME: 202512100849
ISSUE DATE / TIME: 202512102123
ISSUE DATE / TIME: 202512110015
ISSUE DATE / TIME: 202512110854
ISSUE DATE / TIME: 202512110927
Unit Type and Rh: 5100
Unit Type and Rh: 5100
Unit Type and Rh: 5100
Unit Type and Rh: 5100
Unit Type and Rh: 5100
Unit Type and Rh: 5100
Unit Type and Rh: 5100
Unit Type and Rh: 5100
Unit Type and Rh: 6200
Unit Type and Rh: 6200
Unit Type and Rh: 6200
Unit Type and Rh: 6200

## 2024-02-08 LAB — BPAM FFP
Blood Product Expiration Date: 202512112359
Blood Product Expiration Date: 202512112359
Blood Product Expiration Date: 202512142359
Blood Product Expiration Date: 202512142359
Blood Product Expiration Date: 202512172359
Blood Product Expiration Date: 202512172359
ISSUE DATE / TIME: 202512091629
ISSUE DATE / TIME: 202512091629
ISSUE DATE / TIME: 202512091709
ISSUE DATE / TIME: 202512091709
ISSUE DATE / TIME: 202512100334
ISSUE DATE / TIME: 202512100538
Unit Type and Rh: 6200
Unit Type and Rh: 6200
Unit Type and Rh: 6200
Unit Type and Rh: 6200
Unit Type and Rh: 6200
Unit Type and Rh: 6200

## 2024-02-08 LAB — TYPE AND SCREEN
ABO/RH(D): A POS
Antibody Screen: NEGATIVE
Unit division: 0
Unit division: 0
Unit division: 0
Unit division: 0
Unit division: 0
Unit division: 0
Unit division: 0
Unit division: 0
Unit division: 0
Unit division: 0
Unit division: 0
Unit division: 0

## 2024-02-08 LAB — CBC
HCT: 27.3 % — ABNORMAL LOW (ref 36.0–46.0)
Hemoglobin: 9.3 g/dL — ABNORMAL LOW (ref 12.0–15.0)
MCH: 29.1 pg (ref 26.0–34.0)
MCHC: 34.1 g/dL (ref 30.0–36.0)
MCV: 85.3 fL (ref 80.0–100.0)
Platelets: 65 K/uL — ABNORMAL LOW (ref 150–400)
RBC: 3.2 MIL/uL — ABNORMAL LOW (ref 3.87–5.11)
RDW: 17.6 % — ABNORMAL HIGH (ref 11.5–15.5)
WBC: 16.8 K/uL — ABNORMAL HIGH (ref 4.0–10.5)
nRBC: 0 % (ref 0.0–0.2)

## 2024-02-08 LAB — CALCIUM, IONIZED
Calcium, Ionized, Serum: 4.7 mg/dL (ref 4.5–5.6)
Calcium, Ionized, Serum: 4.8 mg/dL (ref 4.5–5.6)

## 2024-02-08 LAB — BASIC METABOLIC PANEL WITH GFR
Anion gap: 14 (ref 5–15)
BUN: 19 mg/dL (ref 8–23)
CO2: 18 mmol/L — ABNORMAL LOW (ref 22–32)
Calcium: 8.3 mg/dL — ABNORMAL LOW (ref 8.9–10.3)
Chloride: 110 mmol/L (ref 98–111)
Creatinine, Ser: 1.32 mg/dL — ABNORMAL HIGH (ref 0.44–1.00)
GFR, Estimated: 43 mL/min — ABNORMAL LOW
Glucose, Bld: 120 mg/dL — ABNORMAL HIGH (ref 70–99)
Potassium: 4.5 mmol/L (ref 3.5–5.1)
Sodium: 142 mmol/L (ref 135–145)

## 2024-02-08 LAB — GLUCOSE, CAPILLARY
Glucose-Capillary: 113 mg/dL — ABNORMAL HIGH (ref 70–99)
Glucose-Capillary: 114 mg/dL — ABNORMAL HIGH (ref 70–99)
Glucose-Capillary: 121 mg/dL — ABNORMAL HIGH (ref 70–99)
Glucose-Capillary: 125 mg/dL — ABNORMAL HIGH (ref 70–99)
Glucose-Capillary: 134 mg/dL — ABNORMAL HIGH (ref 70–99)
Glucose-Capillary: 148 mg/dL — ABNORMAL HIGH (ref 70–99)

## 2024-02-08 LAB — SURGICAL PATHOLOGY

## 2024-02-08 LAB — VITAMIN D 25 HYDROXY (VIT D DEFICIENCY, FRACTURES): Vit D, 25-Hydroxy: 25.47 ng/mL — ABNORMAL LOW (ref 30–100)

## 2024-02-08 MED ORDER — LATANOPROST 0.005 % OP SOLN
1.0000 [drp] | Freq: Every day | OPHTHALMIC | Status: DC
  Administered 2024-02-08 – 2024-02-25 (×18): 1 [drp] via OPHTHALMIC

## 2024-02-08 MED ORDER — FUROSEMIDE 10 MG/ML IJ SOLN
40.0000 mg | Freq: Once | INTRAMUSCULAR | Status: AC
  Administered 2024-02-08: 40 mg via INTRAVENOUS

## 2024-02-08 MED ADMIN — Acetaminophen Tab 500 MG: 1000 mg | ORAL | NDC 50580045711

## 2024-02-08 MED ADMIN — Nutritional Supplement Liquid: 1 | ORAL | NDC 4390018666

## 2024-02-08 MED ADMIN — Oxycodone HCl Tab 5 MG: 5 mg | ORAL | NDC 68084035411

## 2024-02-08 MED ADMIN — Guaifenesin Tab ER 12HR 600 MG: 600 mg | ORAL | NDC 63824000850

## 2024-02-08 MED ADMIN — Cefazolin Sodium-Dextrose IV Solution 2 GM/100ML-4%: 2 g | INTRAVENOUS | NDC 00338350841

## 2024-02-08 MED ADMIN — Metoclopramide HCl Inj 5 MG/ML (Base Equivalent): 10 mg | INTRAVENOUS | NDC 00409341421

## 2024-02-08 MED ADMIN — Polyethylene Glycol 3350 Oral Packet 17 GM: 17 g | ORAL | NDC 00904693186

## 2024-02-08 MED ADMIN — Docusate Sodium Liquid 150 MG/15ML: 100 mg | ORAL | NDC 00904727966

## 2024-02-08 MED ADMIN — Morphine Sulfate IV Soln PF 2 MG/ML: 2 mg | INTRAVENOUS | NDC 63323045200

## 2024-02-08 NOTE — Progress Notes (Signed)
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
## 2024-02-08 NOTE — Progress Notes (Addendum)
 Patient with decreased UOP (156) this shift. TMD paged and requested lasix trail since the patient is 7.5L positive this admission. TMD instructed me to wait until she is finished in the OR and will address issues.   Spoke with TMD, agrees with lasix trial but wants AM labs back first to check BUN/Cr. Informed to let her know when BMET is back.
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
## 2024-02-09 ENCOUNTER — Inpatient Hospital Stay (HOSPITAL_COMMUNITY)

## 2024-02-09 LAB — CBC
HCT: 25.7 % — ABNORMAL LOW (ref 36.0–46.0)
Hemoglobin: 8.7 g/dL — ABNORMAL LOW (ref 12.0–15.0)
MCH: 28.9 pg (ref 26.0–34.0)
MCHC: 33.9 g/dL (ref 30.0–36.0)
MCV: 85.4 fL (ref 80.0–100.0)
Platelets: 94 K/uL — ABNORMAL LOW (ref 150–400)
RBC: 3.01 MIL/uL — ABNORMAL LOW (ref 3.87–5.11)
RDW: 17.2 % — ABNORMAL HIGH (ref 11.5–15.5)
WBC: 20.2 K/uL — ABNORMAL HIGH (ref 4.0–10.5)
nRBC: 0.2 % (ref 0.0–0.2)

## 2024-02-09 LAB — GLUCOSE, CAPILLARY
Glucose-Capillary: 110 mg/dL — ABNORMAL HIGH (ref 70–99)
Glucose-Capillary: 114 mg/dL — ABNORMAL HIGH (ref 70–99)
Glucose-Capillary: 119 mg/dL — ABNORMAL HIGH (ref 70–99)
Glucose-Capillary: 124 mg/dL — ABNORMAL HIGH (ref 70–99)
Glucose-Capillary: 130 mg/dL — ABNORMAL HIGH (ref 70–99)
Glucose-Capillary: 132 mg/dL — ABNORMAL HIGH (ref 70–99)

## 2024-02-09 LAB — BASIC METABOLIC PANEL WITH GFR
Anion gap: 11 (ref 5–15)
BUN: 30 mg/dL — ABNORMAL HIGH (ref 8–23)
CO2: 20 mmol/L — ABNORMAL LOW (ref 22–32)
Calcium: 8 mg/dL — ABNORMAL LOW (ref 8.9–10.3)
Chloride: 108 mmol/L (ref 98–111)
Creatinine, Ser: 1.63 mg/dL — ABNORMAL HIGH (ref 0.44–1.00)
GFR, Estimated: 33 mL/min — ABNORMAL LOW
Glucose, Bld: 116 mg/dL — ABNORMAL HIGH (ref 70–99)
Potassium: 4.6 mmol/L (ref 3.5–5.1)
Sodium: 139 mmol/L (ref 135–145)

## 2024-02-09 LAB — CREATININE, URINE, RANDOM: Creatinine, Urine: 133 mg/dL

## 2024-02-09 LAB — SODIUM, URINE, RANDOM: Sodium, Ur: <30 mmol/L

## 2024-02-09 MED ORDER — FUROSEMIDE 10 MG/ML IJ SOLN
80.0000 mg | Freq: Once | INTRAMUSCULAR | Status: AC
  Administered 2024-02-09: 80 mg via INTRAVENOUS

## 2024-02-09 MED ADMIN — Methocarbamol Inj 1000 MG/10ML: 500 mg | INTRAVENOUS | NDC 55150022310

## 2024-02-09 MED ADMIN — Acetaminophen Tab 500 MG: 1000 mg | ORAL | NDC 50580045711

## 2024-02-09 MED ADMIN — Guaifenesin Tab ER 12HR 600 MG: 1200 mg | ORAL | NDC 63824000850

## 2024-02-09 MED ADMIN — Nutritional Supplement Liquid: 1 | ORAL | NDC 4390018666

## 2024-02-09 MED ADMIN — Oxycodone HCl Tab 5 MG: 7.5 mg | ORAL | NDC 68084035411

## 2024-02-09 MED ADMIN — Oxycodone HCl Tab 5 MG: 7.5 mg | ORAL | NDC 10702001850

## 2024-02-09 MED ADMIN — Oxycodone HCl Tab 5 MG: 5 mg | ORAL | NDC 68084035411

## 2024-02-09 MED ADMIN — Acetaminophen IV Soln 10 MG/ML: 1000 mg | INTRAVENOUS | NDC 63323043441

## 2024-02-09 MED ADMIN — Albumin, Human Inj 5%: 25 g | INTRAVENOUS | NDC 44206031025

## 2024-02-09 MED ADMIN — Albumin, Human Inj 5%: 25 g | INTRAVENOUS | NDC 44206031090

## 2024-02-09 MED ADMIN — Metoclopramide HCl Inj 5 MG/ML (Base Equivalent): 10 mg | INTRAVENOUS | NDC 00409341421

## 2024-02-09 MED ADMIN — Hydromorphone HCl Inj 1 MG/ML: 0.25 mg | INTRAVENOUS | NDC 76045000901

## 2024-02-09 MED ADMIN — Polyethylene Glycol 3350 Oral Packet 17 GM: 17 g | ORAL | NDC 00904693186
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
## 2024-02-09 NOTE — Progress Notes (Signed)
 RN and RT attempted to NT suction the patient. Patient vomited a lage amount of dark brown emesis. I notified Dr. Paola verbal orders redeived to place an NG to [facility]. Total emesis collected was 450. Chest/ABD xray obtained for NG placement. Family was updated and at the bedside.
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
## 2024-02-09 NOTE — Progress Notes (Signed)
 RT and RN attempted to NTS pt but was unsuccessful.
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
## 2024-02-09 NOTE — Plan of Care (Signed)
  Problem: Clinical Measurements: Goal: Respiratory complications will improve Outcome: Progressing Goal: Cardiovascular complication will be avoided Outcome: Progressing   Problem: Activity: Goal: Risk for activity intolerance will decrease Outcome: Progressing   Problem: Coping: Goal: Level of anxiety will decrease Outcome: Progressing   Problem: Pain Managment: Goal: General experience of comfort will improve and/or be controlled Outcome: Progressing   Problem: Safety: Goal: Ability to remain free from injury will improve Outcome: Progressing   Problem: Skin Integrity: Goal: Risk for impaired skin integrity will decrease Outcome: Progressing

## 2024-02-10 ENCOUNTER — Inpatient Hospital Stay (HOSPITAL_COMMUNITY)

## 2024-02-10 LAB — POCT I-STAT 7, (LYTES, BLD GAS, ICA,H+H)
Acid-Base Excess: 1 mmol/L (ref 0.0–2.0)
Bicarbonate: 25.8 mmol/L (ref 20.0–28.0)
Calcium, Ion: 1.19 mmol/L (ref 1.15–1.40)
HCT: 25 % — ABNORMAL LOW (ref 36.0–46.0)
Hemoglobin: 8.5 g/dL — ABNORMAL LOW (ref 12.0–15.0)
O2 Saturation: 90 %
Patient temperature: 102.2
Potassium: 4.5 mmol/L (ref 3.5–5.1)
Sodium: 146 mmol/L — ABNORMAL HIGH (ref 135–145)
TCO2: 27 mmol/L (ref 22–32)
pCO2 arterial: 47.3 mmHg (ref 32–48)
pH, Arterial: 7.354 (ref 7.35–7.45)
pO2, Arterial: 69 mmHg — ABNORMAL LOW (ref 83–108)

## 2024-02-10 LAB — BASIC METABOLIC PANEL WITH GFR
Anion gap: 10 (ref 5–15)
BUN: 33 mg/dL — ABNORMAL HIGH (ref 8–23)
CO2: 23 mmol/L (ref 22–32)
Calcium: 8.4 mg/dL — ABNORMAL LOW (ref 8.9–10.3)
Chloride: 108 mmol/L (ref 98–111)
Creatinine, Ser: 1.43 mg/dL — ABNORMAL HIGH (ref 0.44–1.00)
GFR, Estimated: 39 mL/min — ABNORMAL LOW
Glucose, Bld: 139 mg/dL — ABNORMAL HIGH (ref 70–99)
Potassium: 4.4 mmol/L (ref 3.5–5.1)
Sodium: 141 mmol/L (ref 135–145)

## 2024-02-10 LAB — CBC
HCT: 20.8 % — ABNORMAL LOW (ref 36.0–46.0)
Hemoglobin: 7.1 g/dL — ABNORMAL LOW (ref 12.0–15.0)
MCH: 28.9 pg (ref 26.0–34.0)
MCHC: 34.1 g/dL (ref 30.0–36.0)
MCV: 84.6 fL (ref 80.0–100.0)
Platelets: 125 K/uL — ABNORMAL LOW (ref 150–400)
RBC: 2.46 MIL/uL — ABNORMAL LOW (ref 3.87–5.11)
RDW: 17 % — ABNORMAL HIGH (ref 11.5–15.5)
WBC: 19.6 K/uL — ABNORMAL HIGH (ref 4.0–10.5)
nRBC: 0.6 % — ABNORMAL HIGH (ref 0.0–0.2)

## 2024-02-10 LAB — URINALYSIS, ROUTINE W REFLEX MICROSCOPIC
Bacteria, UA: NONE SEEN
Bilirubin Urine: NEGATIVE
Glucose, UA: NEGATIVE mg/dL
Ketones, ur: 5 mg/dL — AB
Leukocytes,Ua: NEGATIVE
Nitrite: NEGATIVE
Protein, ur: 30 mg/dL — AB
RBC / HPF: >50 RBC/hpf (ref 0–5)
Specific Gravity, Urine: 1.018 (ref 1.005–1.030)
pH: 5 (ref 5.0–8.0)

## 2024-02-10 LAB — GLUCOSE, CAPILLARY
Glucose-Capillary: 105 mg/dL — ABNORMAL HIGH (ref 70–99)
Glucose-Capillary: 106 mg/dL — ABNORMAL HIGH (ref 70–99)
Glucose-Capillary: 107 mg/dL — ABNORMAL HIGH (ref 70–99)
Glucose-Capillary: 108 mg/dL — ABNORMAL HIGH (ref 70–99)
Glucose-Capillary: 116 mg/dL — ABNORMAL HIGH (ref 70–99)
Glucose-Capillary: 90 mg/dL (ref 70–99)

## 2024-02-10 MED ORDER — METOCLOPRAMIDE HCL 5 MG PO TABS: 5.0000 mg | ORAL_TABLET | Freq: Three times a day (TID) | ORAL | Status: DC | PRN

## 2024-02-10 MED ORDER — POLYETHYLENE GLYCOL 3350 17 G PO PACK
17.0000 g | PACK | Freq: Every day | ORAL | Status: DC
  Administered 2024-02-12 – 2024-02-13 (×2): 17 g

## 2024-02-10 MED ADMIN — Methocarbamol Inj 1000 MG/10ML: 500 mg | INTRAVENOUS | NDC 55150022310

## 2024-02-10 MED ADMIN — Heparin Sodium (Porcine) Inj 5000 Unit/ML: 5000 [IU] | SUBCUTANEOUS | NDC 72572025501

## 2024-02-10 MED ADMIN — Piperacillin Sod-Tazobactam Sod in Dex IV Sol 3-0.375GM/50ML: 3.375 g | INTRAVENOUS | NDC 00338963601

## 2024-02-10 MED ADMIN — Hydromorphone HCl Inj 1 MG/ML: 0.5 mg | INTRAVENOUS | NDC 76045000901

## 2024-02-10 MED ADMIN — Acetaminophen IV Soln 10 MG/ML: 1000 mg | INTRAVENOUS | NDC 63323043441

## 2024-02-10 MED ADMIN — Albumin, Human Inj 5%: 25 g | INTRAVENOUS | NDC 44206031090

## 2024-02-10 MED ADMIN — Lactated Ringer's Solution: 150 mL/h | INTRAVENOUS | NDC 00264775000

## 2024-02-10 MED ADMIN — Hydromorphone HCl Inj 1 MG/ML: 0.25 mg | INTRAVENOUS | NDC 76045000901

## 2024-02-10 NOTE — Plan of Care (Signed)
  Problem: Coping: Goal: Level of anxiety will decrease Outcome: Progressing   Problem: Elimination: Goal: Will not experience complications related to urinary retention Outcome: Progressing   Problem: Pain Managment: Goal: General experience of comfort will improve and/or be controlled Outcome: Progressing   Problem: Safety: Goal: Ability to remain free from injury will improve Outcome: Progressing

## 2024-02-10 NOTE — Plan of Care (Signed)
  Problem: Education: Goal: Knowledge of General Education information will improve Description: Including pain rating scale, medication(s)/side effects and non-pharmacologic comfort measures Outcome: Progressing   Problem: Clinical Measurements: Goal: Ability to maintain clinical measurements within normal limits will improve Outcome: Progressing Goal: Will remain free from infection Outcome: Progressing Goal: Diagnostic test results will improve Outcome: Progressing Goal: Respiratory complications will improve Outcome: Progressing Goal: Cardiovascular complication will be avoided Outcome: Progressing   Problem: Activity: Goal: Risk for activity intolerance will decrease Outcome: Progressing   Problem: Nutrition: Goal: Adequate nutrition will be maintained Outcome: Progressing   Problem: Coping: Goal: Level of anxiety will decrease Outcome: Progressing   Problem: Pain Managment: Goal: General experience of comfort will improve and/or be controlled Outcome: Progressing   Problem: Safety: Goal: Ability to remain free from injury will improve Outcome: Progressing   Problem: Skin Integrity: Goal: Risk for impaired skin integrity will decrease Outcome: Progressing

## 2024-02-10 NOTE — Progress Notes (Signed)
 PT Cancellation Note  Patient Details Name: Theresa Villarreal MRN: [id number] DOB: Sep 05, 1950   Cancelled Treatment:    Reason Eval/Treat Not Completed: (P) Other (comment);Medical issues which prohibited therapy (Decreased respiratory status, nursing deferral at this time.)   Jaysen Wey J Laisha Rau 02/10/2024, 1:02 PM Toya HAMS , PTA Acute Rehabilitation Services Office [phone]

## 2024-02-10 NOTE — Progress Notes (Signed)
 Patient ID: Theresa Villarreal, female   DOB: 09-28-1950, 73 y.o.   MRN: [id number]   Follow up - Trauma Critical Care  Patient Details:    Theresa Villarreal is an 73 y.o. female.  Lines/tubes : Chest Tube 1 Lateral;Left (Active)  Status To water seal 02/10/24 1200  Chest Tube Air Leak None 02/10/24 1200  Patency Intervention Tip/tilt 02/10/24 1200  Drainage Description Sanguineous 02/10/24 1200  Dressing Status Clean, Dry, Intact 02/10/24 1200  Dressing Intervention Assessed, no intervention needed 02/10/24 1200  Site Assessment Clean, Dry, Intact 02/10/24 1200  Surrounding Skin Dry;Intact 02/10/24 1200  Intake (mL) 0 mL 02/09/24 0000  Output (mL) 40 mL 02/10/24 1300     Urethral Catheter Tori Double-lumen;Temperature probe (Active)  Indication for Insertion or Continuance of Catheter Bladder outlet obstruction / other urologic reason 02/10/24 0723  Site Assessment Clean, Dry, Intact 02/10/24 0723  Catheter Maintenance Bag below level of bladder;No dependent loops;Drainage bag/tubing not touching floor;Catheter secured;Insertion date on drainage bag;Seal intact 02/10/24 0723  Collection Container Standard drainage bag 02/10/24 0723  Securement Method Adhesive securement device 02/10/24 0723  Input (mL) 195 mL 02/07/24 1600  Output (mL) 225 mL 02/10/24 1000    Microbiology/Sepsis markers: Results for orders placed or performed during the hospital encounter of 02/06/24  MRSA Next Gen by PCR, Nasal     Status: None   Collection Time: 02/06/24  6:29 PM   Specimen: Nasal Mucosa; Nasal Swab  Result Value Ref Range Status   MRSA by PCR Next Gen NOT DETECTED NOT DETECTED Final    Comment: (NOTE) The GeneXpert MRSA Assay (FDA approved for NASAL specimens only), is one component of a comprehensive MRSA colonization surveillance program. It is not intended to diagnose MRSA infection nor to guide or monitor treatment for MRSA infections. Test performance is not FDA approved in patients less  than 56 years old. Performed at [facility], [address], [place], [place] [address]     Anti-infectives:  Anti-infectives (From admission, onward)    Start     Dose/Rate Route Frequency Ordered Stop   02/07/24 1800  ceFAZolin  (ANCEF ) IVPB 2g/100 mL premix        2 g 200 mL/hr over 30 Minutes Intravenous Every 8 hours 02/07/24 1201 02/08/24 1132   02/07/24 0900  ceFAZolin  (ANCEF ) IVPB 2g/100 mL premix  Status:  Discontinued        2 g 200 mL/hr over 30 Minutes Intravenous To ShortStay Surgical 02/07/24 0851 02/07/24 1201   02/06/24 1645  ceFAZolin  (ANCEF ) IVPB 2g/100 mL premix        2 g 200 mL/hr over 30 Minutes Intravenous  Once 02/06/24 1642 02/06/24 1704       Best Practice/Protocols:  VTE Prophylaxis: Heparin  (SQ) N/a  Consults: Treatment Team:  Alvaro Ricardo KATHEE Mickey., MD Kendal Franky SQUIBB, MD    Studies:    Events:  Subjective:    Overnight Issues:  Low grade temp; pain issues; vomited last pm, NG placed Objective:  Vital signs for last 24 hours: Temp:  [99.7 F (37.6 C)-101.3 F (38.5 C)] 101.1 F (38.4 C) (12/13 1400) Pulse Rate:  [108-225] 119 (12/13 1400) Resp:  [14-30] 21 (12/13 1400) BP: (118-152)/(60-85) 145/69 (12/13 1400) SpO2:  [91 %-100 %] 91 % (12/13 1400)  Hemodynamic parameters for last 24 hours:    Intake/Output from previous day: 12/12 0701 - 12/13 0700 In: 1574.1 [P.O.:590; IV Piggyback:984.1] Out: 1891 [Urine:1415; Chest Tube:476]  Intake/Output this shift: Total  I/O In: 100 [IV Piggyback:100] Out: 480 [Urine:350; Chest Tube:130]  Vent settings for last 24 hours:    Physical Exam:  Gen: appears comfortable, no distress Neuro: follows commands, alert, communicative HEENT: PERRL Neck: supple CV: tachycardic Pulm: symm chest rise; not taking deep breaths; CT 55 - old blood Abd: soft, NT, incision clean, dry, intact , no recent BM GU: urine clear and yellow, +Foley Extr: wwp, no edema  Results for orders  placed or performed during the hospital encounter of 02/06/24 (from the past 24 hours)  Glucose, capillary     Status: Abnormal   Collection Time: 02/09/24  4:59 PM  Result Value Ref Range   Glucose-Capillary 119 (H) 70 - 99 mg/dL  Glucose, capillary     Status: Abnormal   Collection Time: 02/09/24  7:53 PM  Result Value Ref Range   Glucose-Capillary 124 (H) 70 - 99 mg/dL  Glucose, capillary     Status: Abnormal   Collection Time: 02/09/24 11:41 PM  Result Value Ref Range   Glucose-Capillary 132 (H) 70 - 99 mg/dL  Basic metabolic panel with GFR     Status: Abnormal   Collection Time: 02/10/24  2:34 AM  Result Value Ref Range   Sodium 141 135 - 145 mmol/L   Potassium 4.4 3.5 - 5.1 mmol/L   Chloride 108 98 - 111 mmol/L   CO2 23 22 - 32 mmol/L   Glucose, Bld 139 (H) 70 - 99 mg/dL   BUN 33 (H) 8 - 23 mg/dL   Creatinine, Ser 8.56 (H) 0.44 - 1.00 mg/dL   Calcium  8.4 (L) 8.9 - 10.3 mg/dL   GFR, Estimated 39 (L) >60 mL/min   Anion gap 10 5 - 15  CBC     Status: Abnormal   Collection Time: 02/10/24  2:34 AM  Result Value Ref Range   WBC 19.6 (H) 4.0 - 10.5 K/uL   RBC 2.46 (L) 3.87 - 5.11 MIL/uL   Hemoglobin 7.1 (L) 12.0 - 15.0 g/dL   HCT 79.1 (L) 63.9 - 53.9 %   MCV 84.6 80.0 - 100.0 fL   MCH 28.9 26.0 - 34.0 pg   MCHC 34.1 30.0 - 36.0 g/dL   RDW 82.9 (H) 88.4 - 84.4 %   Platelets 125 (L) 150 - 400 K/uL   nRBC 0.6 (H) 0.0 - 0.2 %  Glucose, capillary     Status: Abnormal   Collection Time: 02/10/24  4:35 AM  Result Value Ref Range   Glucose-Capillary 107 (H) 70 - 99 mg/dL  Glucose, capillary     Status: Abnormal   Collection Time: 02/10/24  7:43 AM  Result Value Ref Range   Glucose-Capillary 116 (H) 70 - 99 mg/dL  Glucose, capillary     Status: None   Collection Time: 02/10/24 11:46 AM  Result Value Ref Range   Glucose-Capillary 90 70 - 99 mg/dL  Glucose, capillary     Status: Abnormal   Collection Time: 02/10/24  4:08 PM  Result Value Ref Range   Glucose-Capillary 108 (H)  70 - 99 mg/dL    Assessment & Plan: Present on Admission:  Trauma    LOS: 4 days   Additional comments:I reviewed the patient's new clinical lab test results. , I reviewed the patients new imaging test results - L lung looks better on cxr today.  and I have discussed and reviewed with family members patient's son  Hemorrhagic shock -2 u whole blood, 2u pRBC, 2 FFP  LUL pulm ctxn and  laceration with HTX - 28 F chest tube placed in ED L rib fx 3-11 Traumatic L diaphragmatic hernia - S/P repair 12/9 Grade 5 splenic laceration - S/P splenectomy 12/9, will need vaccines prior to D/C B superior and inferior pubic rami fx with associated pelvic hematoma, L sacral ala fx - S/P perc fixation by Dr. Kendal 12/10, WBAT RLE, TDWB LLE Extraperitoneal bladder injury - Urology c/s, Dr. Alvaro CKD Remote R cerebellar infarct     Neuro - alert, F/C   CV - well resuscitated   Pulm - acute hypoxic resp failure, extubated 12/10, incr guaifenesin  - L chest tube to WS, CXR P -at risk for re-intubation now on 6L -will ask anesthesia to see if pt candidate for thoracic epidural for pain control   FEN/GI - ileus expected, NG tube to LIWS   GU - foley, good uop - creat 1.43 improved   Heme/ID - hgb 8.7-->7 - will need splenectomy vaccines 12/23 or prior to discharge - thrombocytopenia is consumptive -repeat cbc in am, maintain type and cross   Endo - BG in normal range   PPX - SCDs, subcu heparin  started 12/13 - monitor plts   LTDW - fem CVC 12/9 - midline laparotomy 12/9 - staples out 12/16-12/19 - foley keep until 12/22 for EP bladder rupture, CT cysto prior to removal   Dispo - ICU. Therapies, lasix  P CXR and urine studies; work on pain control for rib fx, at risk for reintubation - d/w with son  Critical Care Total Time*: 30 Minutes  Camellia HERO. Tanda, MD, FACS General, Bariatric, & Minimally Invasive Surgery [facility]   02/10/2024  *Care during the described time interval was provided by me. I have reviewed this patient's available data, including medical history, events of note, physical examination and test results as part of my evaluation.

## 2024-02-11 LAB — GLUCOSE, CAPILLARY
Glucose-Capillary: 101 mg/dL — ABNORMAL HIGH (ref 70–99)
Glucose-Capillary: 105 mg/dL — ABNORMAL HIGH (ref 70–99)
Glucose-Capillary: 113 mg/dL — ABNORMAL HIGH (ref 70–99)
Glucose-Capillary: 121 mg/dL — ABNORMAL HIGH (ref 70–99)
Glucose-Capillary: 126 mg/dL — ABNORMAL HIGH (ref 70–99)
Glucose-Capillary: 99 mg/dL (ref 70–99)

## 2024-02-11 LAB — BASIC METABOLIC PANEL WITH GFR
Anion gap: 12 (ref 5–15)
BUN: 30 mg/dL — ABNORMAL HIGH (ref 8–23)
CO2: 24 mmol/L (ref 22–32)
Calcium: 8.2 mg/dL — ABNORMAL LOW (ref 8.9–10.3)
Chloride: 109 mmol/L (ref 98–111)
Creatinine, Ser: 1.11 mg/dL — ABNORMAL HIGH (ref 0.44–1.00)
GFR, Estimated: 52 mL/min — ABNORMAL LOW
Glucose, Bld: 127 mg/dL — ABNORMAL HIGH (ref 70–99)
Potassium: 4.6 mmol/L (ref 3.5–5.1)
Sodium: 145 mmol/L (ref 135–145)

## 2024-02-11 LAB — CBC
HCT: 22.4 % — ABNORMAL LOW (ref 36.0–46.0)
Hemoglobin: 7.2 g/dL — ABNORMAL LOW (ref 12.0–15.0)
MCH: 28.8 pg (ref 26.0–34.0)
MCHC: 32.1 g/dL (ref 30.0–36.0)
MCV: 89.6 fL (ref 80.0–100.0)
Platelets: 195 K/uL (ref 150–400)
RBC: 2.5 MIL/uL — ABNORMAL LOW (ref 3.87–5.11)
RDW: 17.2 % — ABNORMAL HIGH (ref 11.5–15.5)
WBC: 14 K/uL — ABNORMAL HIGH (ref 4.0–10.5)
nRBC: 4 % — ABNORMAL HIGH (ref 0.0–0.2)

## 2024-02-11 LAB — PHOSPHORUS: Phosphorus: 2.6 mg/dL (ref 2.5–4.6)

## 2024-02-11 LAB — MAGNESIUM: Magnesium: 2.3 mg/dL (ref 1.7–2.4)

## 2024-02-11 MED ORDER — SODIUM CHLORIDE 3 % IN NEBU
4.0000 mL | INHALATION_SOLUTION | Freq: Once | RESPIRATORY_TRACT | Status: AC
  Administered 2024-02-11: 4 mL via RESPIRATORY_TRACT

## 2024-02-11 MED ADMIN — Methocarbamol Inj 1000 MG/10ML: 500 mg | INTRAVENOUS | NDC 55150022310

## 2024-02-11 MED ADMIN — Heparin Sodium (Porcine) Inj 5000 Unit/ML: 5000 [IU] | SUBCUTANEOUS | NDC 72572025501

## 2024-02-11 MED ADMIN — Piperacillin Sod-Tazobactam Sod in Dex IV Sol 3-0.375GM/50ML: 3.375 g | INTRAVENOUS | NDC 00338963601

## 2024-02-11 MED ADMIN — Oxycodone HCl Tab 5 MG: 5 mg | NDC 10702001850

## 2024-02-11 MED ADMIN — Oxycodone HCl Tab 5 MG: 7.5 mg | NDC 00406055223

## 2024-02-11 MED ADMIN — Oxycodone HCl Tab 5 MG: 7.5 mg | NDC 10702001850

## 2024-02-11 MED ADMIN — Acetaminophen IV Soln 10 MG/ML: 1000 mg | INTRAVENOUS | NDC 63323043441

## 2024-02-11 MED ADMIN — Guaifenesin Tab ER 12HR 600 MG: 1200 mg | ORAL | NDC 63824000850

## 2024-02-11 MED ADMIN — Hydromorphone HCl Inj 1 MG/ML: 0.5 mg | INTRAVENOUS | NDC 76045000901

## 2024-02-11 MED ADMIN — Metoclopramide HCl Inj 5 MG/ML (Base Equivalent): 5 mg | INTRAVENOUS | NDC 00409341421

## 2024-02-11 NOTE — Progress Notes (Signed)
 Patient ID: Theresa Villarreal, female   DOB: [birth year], 73 y.o.   MRN: [id number]   Follow up - Trauma Critical Care  Patient Details:    Theresa Villarreal is an 73 y.o. female.  Lines/tubes : Chest Tube 1 Lateral;Left (Active)  Status To water seal 02/11/24 0800  Chest Tube Air Leak None 02/11/24 0800  Patency Intervention Tip/tilt 02/11/24 0800  Drainage Description Sanguineous 02/11/24 0800  Dressing Status Clean, Dry, Intact 02/11/24 0800  Dressing Intervention Assessed, no intervention needed 02/11/24 0800  Site Assessment Clean, Dry, Intact 02/11/24 0800  Surrounding Skin Dry 02/11/24 0800  Intake (mL) 0 mL 02/09/24 0000  Output (mL) 60 mL 02/11/24 0800     NG/OG Vented/Dual Lumen Left nare (Active)  Tube Position (Required) Marking at nare/corner of mouth 02/11/24 0800  Ongoing Placement Verification (Required) (See row information) Yes 02/11/24 0800  Site Assessment Clean, Dry, Intact 02/11/24 0800  Status Low intermittent suction 02/11/24 0800  Output (mL) 50 mL 02/11/24 0600     Urethral Catheter Tori Double-lumen;Temperature probe (Active)  Indication for Insertion or Continuance of Catheter Bladder outlet obstruction / other urologic reason 02/11/24 0800  Site Assessment Clean, Dry, Intact 02/11/24 0800  Catheter Maintenance Bag below level of bladder;Catheter secured;Drainage bag/tubing not touching floor;Insertion date on drainage bag;No dependent loops;Seal intact;Bag emptied prior to transport 02/11/24 0800  Collection Container Standard drainage bag 02/11/24 0800  Securement Method Adhesive securement device 02/11/24 0800  Input (mL) 195 mL 02/07/24 1600  Output (mL) 120 mL 02/11/24 0800    Microbiology/Sepsis markers: Results for orders placed or performed during the hospital encounter of 02/06/24  MRSA Next Gen by PCR, Nasal     Status: None   Collection Time: 02/06/24  6:29 PM   Specimen: Nasal Mucosa; Nasal Swab  Result Value Ref Range Status   MRSA by PCR  Next Gen NOT DETECTED NOT DETECTED Final    Comment: (NOTE) The GeneXpert MRSA Assay (FDA approved for NASAL specimens only), is one component of a comprehensive MRSA colonization surveillance program. It is not intended to diagnose MRSA infection nor to guide or monitor treatment for MRSA infections. Test performance is not FDA approved in patients less than 78 years old. Performed at [facility], [address], [place], [place] [address]   Culture, Respiratory w Gram Stain     Status: None (Preliminary result)   Collection Time: 02/10/24  4:39 PM   Specimen: Tracheal Aspirate; Respiratory  Result Value Ref Range Status   Specimen Description TRACHEAL ASPIRATE  Final   Special Requests NONE  Final   Gram Stain   Final    FEW WBC PRESENT, PREDOMINANTLY PMN FEW GRAM NEGATIVE RODS Performed at [facility], [address] [place], [place] [address]    Culture PENDING  Incomplete   Report Status PENDING  Incomplete    Anti-infectives:  Anti-infectives (From admission, onward)    Start     Dose/Rate Route Frequency Ordered Stop   02/10/24 2200  piperacillin -tazobactam (ZOSYN ) IVPB 3.375 g        3.375 g 12.5 mL/hr over 240 Minutes Intravenous Every 8 hours 02/10/24 2101     02/07/24 1800  ceFAZolin  (ANCEF ) IVPB 2g/100 mL premix        2 g 200 mL/hr over 30 Minutes Intravenous Every 8 hours 02/07/24 1201 02/08/24 1132   02/07/24 0900  ceFAZolin  (ANCEF ) IVPB 2g/100 mL premix  Status:  Discontinued  2 g 200 mL/hr over 30 Minutes Intravenous To ShortStay Surgical 02/07/24 0851 02/07/24 1201   02/06/24 1645  ceFAZolin  (ANCEF ) IVPB 2g/100 mL premix        2 g 200 mL/hr over 30 Minutes Intravenous  Once 02/06/24 1642 02/06/24 1704       Best Practice/Protocols:  VTE Prophylaxis: Heparin  (SQ) N/a  Consults: Treatment Team:  Alvaro Ricardo KATHEE Mickey., MD Kendal Franky SQUIBB, MD    Studies:    Events:  Subjective:    Overnight Issues:  Febrile last  night; fever w/u started; started on zosyn ;  pt a lot more comfortable this am Objective:  Vital signs for last 24 hours: Temp:  [98.4 F (36.9 C)-102.2 F (39 C)] 98.4 F (36.9 C) (12/14 0800) Pulse Rate:  [97-125] 98 (12/14 0800) Resp:  [13-30] 23 (12/14 0800) BP: (119-148)/(53-87) 130/77 (12/14 0800) SpO2:  [91 %-99 %] 98 % (12/14 0800)  Hemodynamic parameters for last 24 hours:    Intake/Output from previous day: 12/13 0701 - 12/14 0700 In: 955.2 [I.V.:705.2; IV Piggyback:250] Out: 2460 [Urine:1850; Emesis/NG output:350; Chest Tube:260]  Intake/Output this shift: Total I/O In: 375.3 [I.V.:255.1; IV Piggyback:120.1] Out: 180 [Urine:120; Chest Tube:60]  Vent settings for last 24 hours:    Physical Exam:  Gen: appears more comfortable, no distress Neuro: follows commands, alert, communicative HEENT: PERRL Neck: supple CV: improved tachycardia Pulm: symm chest rise; not taking deep breaths, IS 450-500; rhonchi; CT 476-->260/24hrs - old blood Abd: soft, NT, incision clean, dry, intact , no recent BM GU: urine clear and yellow, +Foley Extr: wwp, no edema  Results for orders placed or performed during the hospital encounter of 02/06/24 (from the past 24 hours)  Glucose, capillary     Status: None   Collection Time: 02/10/24 11:46 AM  Result Value Ref Range   Glucose-Capillary 90 70 - 99 mg/dL  Glucose, capillary     Status: Abnormal   Collection Time: 02/10/24  4:08 PM  Result Value Ref Range   Glucose-Capillary 108 (H) 70 - 99 mg/dL  Culture, Respiratory w Gram Stain     Status: None (Preliminary result)   Collection Time: 02/10/24  4:39 PM   Specimen: Tracheal Aspirate; Respiratory  Result Value Ref Range   Specimen Description TRACHEAL ASPIRATE    Special Requests NONE    Gram Stain      FEW WBC PRESENT, PREDOMINANTLY PMN FEW GRAM NEGATIVE RODS Performed at [facility], [address], [place], [place] [address]    Culture PENDING    Report Status  PENDING   I-STAT 7, (LYTES, BLD GAS, ICA, H+H)     Status: Abnormal   Collection Time: 02/10/24  8:03 PM  Result Value Ref Range   pH, Arterial 7.354 7.35 - 7.45   pCO2 arterial 47.3 32 - 48 mmHg   pO2, Arterial 69 (L) 83 - 108 mmHg   Bicarbonate 25.8 20.0 - 28.0 mmol/L   TCO2 27 22 - 32 mmol/L   O2 Saturation 90 %   Acid-Base Excess 1.0 0.0 - 2.0 mmol/L   Sodium 146 (H) 135 - 145 mmol/L   Potassium 4.5 3.5 - 5.1 mmol/L   Calcium , Ion 1.19 1.15 - 1.40 mmol/L   HCT 25.0 (L) 36.0 - 46.0 %   Hemoglobin 8.5 (L) 12.0 - 15.0 g/dL   Patient temperature 897.7 F    Collection site RADIAL, ALLEN'S TEST ACCEPTABLE    Drawn by RT    Sample type ARTERIAL  Glucose, capillary     Status: Abnormal   Collection Time: 02/10/24  8:37 PM  Result Value Ref Range   Glucose-Capillary 105 (H) 70 - 99 mg/dL  Urinalysis, Routine w reflex microscopic -Urine, Catheterized; Indwelling urinary catheter     Status: Abnormal   Collection Time: 02/10/24 10:46 PM  Result Value Ref Range   Color, Urine YELLOW YELLOW   APPearance CLEAR CLEAR   Specific Gravity, Urine 1.018 1.005 - 1.030   pH 5.0 5.0 - 8.0   Glucose, UA NEGATIVE NEGATIVE mg/dL   Hgb urine dipstick MODERATE (A) NEGATIVE   Bilirubin Urine NEGATIVE NEGATIVE   Ketones, ur 5 (A) NEGATIVE mg/dL   Protein, ur 30 (A) NEGATIVE mg/dL   Nitrite NEGATIVE NEGATIVE   Leukocytes,Ua NEGATIVE NEGATIVE   RBC / HPF >50 0 - 5 RBC/hpf   WBC, UA 0-5 0 - 5 WBC/hpf   Bacteria, UA NONE SEEN NONE SEEN   Squamous Epithelial / HPF 0-5 0 - 5 /HPF   Mucus PRESENT    Hyaline Casts, UA PRESENT   Glucose, capillary     Status: Abnormal   Collection Time: 02/10/24 11:50 PM  Result Value Ref Range   Glucose-Capillary 106 (H) 70 - 99 mg/dL  Glucose, capillary     Status: Abnormal   Collection Time: 02/11/24  3:59 AM  Result Value Ref Range   Glucose-Capillary 126 (H) 70 - 99 mg/dL  Type and screen [facility]     Status: None   Collection Time:  02/11/24  4:33 AM  Result Value Ref Range   ABO/RH(D) A POS    Antibody Screen NEG    Sample Expiration      02/14/2024,2359 Performed at [facility], [address], [place], [place] [address]   Basic metabolic panel with GFR     Status: Abnormal   Collection Time: 02/11/24  4:34 AM  Result Value Ref Range   Sodium 145 135 - 145 mmol/L   Potassium 4.6 3.5 - 5.1 mmol/L   Chloride 109 98 - 111 mmol/L   CO2 24 22 - 32 mmol/L   Glucose, Bld 127 (H) 70 - 99 mg/dL   BUN 30 (H) 8 - 23 mg/dL   Creatinine, Ser 8.88 (H) 0.44 - 1.00 mg/dL   Calcium  8.2 (L) 8.9 - 10.3 mg/dL   GFR, Estimated 52 (L) >60 mL/min   Anion gap 12 5 - 15  CBC     Status: Abnormal   Collection Time: 02/11/24  4:34 AM  Result Value Ref Range   WBC 14.0 (H) 4.0 - 10.5 K/uL   RBC 2.50 (L) 3.87 - 5.11 MIL/uL   Hemoglobin 7.2 (L) 12.0 - 15.0 g/dL   HCT 77.5 (L) 63.9 - 53.9 %   MCV 89.6 80.0 - 100.0 fL   MCH 28.8 26.0 - 34.0 pg   MCHC 32.1 30.0 - 36.0 g/dL   RDW 82.7 (H) 88.4 - 84.4 %   Platelets 195 150 - 400 K/uL   nRBC 4.0 (H) 0.0 - 0.2 %  Magnesium     Status: None   Collection Time: 02/11/24  4:34 AM  Result Value Ref Range   Magnesium 2.3 1.7 - 2.4 mg/dL  Phosphorus     Status: None   Collection Time: 02/11/24  4:34 AM  Result Value Ref Range   Phosphorus 2.6 2.5 - 4.6 mg/dL  Glucose, capillary     Status: Abnormal   Collection Time: 02/11/24  7:44  AM  Result Value Ref Range   Glucose-Capillary 121 (H) 70 - 99 mg/dL    Assessment & Plan: Present on Admission:  Trauma    LOS: 5 days   Additional comments:I reviewed the patient's new clinical lab test results. , I reviewed the patients new imaging test results - .  and I have discussed and reviewed with family members patient's son  Hemorrhagic shock -2 u whole blood, 2u pRBC, 2 FFP  Acute blood loss anemia - LUL pulm ctxn and laceration with HTX - 28 F chest tube placed in ED L rib fx 3-11 Traumatic L diaphragmatic hernia - S/P repair  12/9 Grade 5 splenic laceration - S/P splenectomy 12/9, will need vaccines prior to D/C B superior and inferior pubic rami fx with associated pelvic hematoma, L sacral ala fx - S/P perc fixation by Dr. Kendal 12/10, WBAT RLE, TDWB LLE Extraperitoneal bladder injury - Urology c/s, Dr. Alvaro CKD Remote R cerebellar infarct     Neuro - alert, F/C   CV - well resuscitated   Pulm - acute hypoxic resp failure, extubated 12/10, incr guaifenesin  - L chest tube to WS, CXR bilateral opacities -at risk for re-intubation now on 6L - anesthesia not a candidate for thoracic epidural for pain control   FEN/GI - ileus expected, NG tube to LIWS, output going down; prob able to remove Monday   GU - foley, good uop - creat 1.1 improved   Heme/ID - hgb 8.7-->7-->7 - will need splenectomy vaccines 12/23 or prior to discharge - thrombocytopenia is consumptive -repeat cbc in am, maintain type and cross -zosyn  12/13--> -f/u cultures -ua 12/13 neg  Endo - BG in normal range   PPX - SCDs, subcu heparin  started 12/13 - monitor plts   LTDW - fem CVC 12/9 - midline laparotomy 12/9 - staples out 12/16-12/19 - foley keep until 12/22 for EP bladder rupture, CT cysto prior to removal   Dispo - ICU. Therapies, looks better today; hopefully remove NG in am, can give PO meds today  Critical Care Total Time*: 30 Minutes  Camellia HERO. Tanda, MD, FACS General, Bariatric, & Minimally Invasive Surgery [facility]   02/11/2024  *Care during the described time interval was provided by me. I have reviewed this patient's available data, including medical history, events of note, physical examination and test results as part of my evaluation.

## 2024-02-12 ENCOUNTER — Inpatient Hospital Stay (HOSPITAL_COMMUNITY)

## 2024-02-12 LAB — GLUCOSE, CAPILLARY
Glucose-Capillary: 114 mg/dL — ABNORMAL HIGH (ref 70–99)
Glucose-Capillary: 116 mg/dL — ABNORMAL HIGH (ref 70–99)
Glucose-Capillary: 123 mg/dL — ABNORMAL HIGH (ref 70–99)
Glucose-Capillary: 132 mg/dL — ABNORMAL HIGH (ref 70–99)
Glucose-Capillary: 137 mg/dL — ABNORMAL HIGH (ref 70–99)
Glucose-Capillary: 99 mg/dL (ref 70–99)

## 2024-02-12 LAB — BLOOD CULTURE ID PANEL (REFLEXED) - BCID2

## 2024-02-12 LAB — BASIC METABOLIC PANEL WITH GFR
Anion gap: 8 (ref 5–15)
BUN: 24 mg/dL — ABNORMAL HIGH (ref 8–23)
CO2: 28 mmol/L (ref 22–32)
Calcium: 8.1 mg/dL — ABNORMAL LOW (ref 8.9–10.3)
Chloride: 113 mmol/L — ABNORMAL HIGH (ref 98–111)
Creatinine, Ser: 1.14 mg/dL — ABNORMAL HIGH (ref 0.44–1.00)
GFR, Estimated: 51 mL/min — ABNORMAL LOW
Glucose, Bld: 101 mg/dL — ABNORMAL HIGH (ref 70–99)
Potassium: 4.4 mmol/L (ref 3.5–5.1)
Sodium: 149 mmol/L — ABNORMAL HIGH (ref 135–145)

## 2024-02-12 LAB — CBC
HCT: 21.5 % — ABNORMAL LOW (ref 36.0–46.0)
HCT: 27.2 % — ABNORMAL LOW (ref 36.0–46.0)
Hemoglobin: 6.8 g/dL — CL (ref 12.0–15.0)
Hemoglobin: 8.7 g/dL — ABNORMAL LOW (ref 12.0–15.0)
MCH: 28.6 pg (ref 26.0–34.0)
MCH: 28 pg (ref 26.0–34.0)
MCHC: 31.6 g/dL (ref 30.0–36.0)
MCHC: 32 g/dL (ref 30.0–36.0)
MCV: 90.3 fL (ref 80.0–100.0)
MCV: 87.5 fL (ref 80.0–100.0)
Platelets: 251 K/uL (ref 150–400)
Platelets: 282 K/uL (ref 150–400)
RBC: 2.38 MIL/uL — ABNORMAL LOW (ref 3.87–5.11)
RBC: 3.11 MIL/uL — ABNORMAL LOW (ref 3.87–5.11)
RDW: 17.8 % — ABNORMAL HIGH (ref 11.5–15.5)
RDW: 18.1 % — ABNORMAL HIGH (ref 11.5–15.5)
WBC: 18.5 K/uL — ABNORMAL HIGH (ref 4.0–10.5)
WBC: 18.4 K/uL — ABNORMAL HIGH (ref 4.0–10.5)
nRBC: 8 % — ABNORMAL HIGH (ref 0.0–0.2)
nRBC: 13.8 % — ABNORMAL HIGH (ref 0.0–0.2)

## 2024-02-12 LAB — PREPARE RBC (CROSSMATCH)

## 2024-02-12 LAB — PHOSPHORUS: Phosphorus: 2.2 mg/dL — ABNORMAL LOW (ref 2.5–4.6)

## 2024-02-12 LAB — MAGNESIUM: Magnesium: 2.4 mg/dL (ref 1.7–2.4)

## 2024-02-12 MED ORDER — SODIUM CHLORIDE 0.9% IV SOLUTION: Freq: Once | INTRAVENOUS | Status: AC

## 2024-02-12 MED ADMIN — Heparin Sodium (Porcine) Inj 5000 Unit/ML: 5000 [IU] | SUBCUTANEOUS | NDC 72572025501

## 2024-02-12 MED ADMIN — Piperacillin Sod-Tazobactam Sod in Dex IV Sol 3-0.375GM/50ML: 3.375 g | INTRAVENOUS | NDC 00338963601

## 2024-02-12 MED ADMIN — Oxycodone HCl Tab 5 MG: 5 mg | NDC 00406055223

## 2024-02-12 MED ADMIN — Oxycodone HCl Tab 5 MG: 7.5 mg | NDC 00406055223

## 2024-02-12 MED ADMIN — Oxycodone HCl Tab 5 MG: 7.5 mg | NDC 10702001850

## 2024-02-12 MED ADMIN — Guaifenesin Tab ER 12HR 600 MG: 1200 mg | ORAL | NDC 63824000850

## 2024-02-12 MED ADMIN — Nutritional Supplement Liquid: 1 | ORAL | NDC 4390018666

## 2024-02-12 MED ADMIN — Hydromorphone HCl Inj 1 MG/ML: 0.5 mg | INTRAVENOUS | NDC 76045000901

## 2024-02-12 MED ADMIN — Pot Phos Monobasic w/Sod Phos Di & Monobas Tab 155-852-130MG: 500 mg | ORAL | NDC 64980010401

## 2024-02-12 NOTE — Progress Notes (Signed)
 RT NOTE: CPT held at this time as PT is complaining of severe pain. RT worked with PT on coughing and trying to clear secretions but she is in too much pain to be very effective. RT will attempt CPT at next scheduled time.

## 2024-02-12 NOTE — TOC Progression Note (Signed)
 Transition of Care [facility]) - Progression Note    Patient Details  Name: KALAYNA NOY MRN: [id number] Date of Birth: [birth year]  [facility] CM/SW Contact  Catelynn Sparger E Laureen Frederic, LCSW Phone Number: 02/12/2024, 3:32 PM  Clinical Narrative:    CSW met with patient and daughter Camilo) at bedside. Patient is not a candidate for CIR at this point (per rounds). Patient is agreeable to STR at SNF once medically ready. Denies SNF history. CSW will send out once NG tube is out.     Barriers to Discharge: Continued Medical Work up               Expected Discharge Plan and Services       Living arrangements for the past 2 months: Single Family Home                                       Social Drivers of Health (SDOH) Interventions SDOH Screenings   Tobacco Use: Low Risk (02/07/2024)    Readmission Risk Interventions     No data to display

## 2024-02-12 NOTE — Plan of Care (Signed)
°  Problem: Clinical Measurements: Goal: Ability to maintain clinical measurements within normal limits will improve Outcome: Progressing   Problem: Coping: Goal: Level of anxiety will decrease Outcome: Progressing   Problem: Pain Managment: Goal: General experience of comfort will improve and/or be controlled Outcome: Progressing   Problem: Respiratory: Goal: Ability to maintain adequate ventilation will improve Outcome: Progressing   Problem: Activity: Goal: Risk for activity intolerance will decrease Outcome: Not Progressing   Problem: Nutrition: Goal: Adequate nutrition will be maintained Outcome: Not Progressing
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
## 2024-02-13 ENCOUNTER — Inpatient Hospital Stay (HOSPITAL_COMMUNITY)

## 2024-02-13 LAB — CBC
HCT: 30 % — ABNORMAL LOW (ref 36.0–46.0)
Hemoglobin: 9.7 g/dL — ABNORMAL LOW (ref 12.0–15.0)
MCH: 28.2 pg (ref 26.0–34.0)
MCHC: 32.3 g/dL (ref 30.0–36.0)
MCV: 87.2 fL (ref 80.0–100.0)
Platelets: 326 K/uL (ref 150–400)
RBC: 3.44 MIL/uL — ABNORMAL LOW (ref 3.87–5.11)
RDW: 19.2 % — ABNORMAL HIGH (ref 11.5–15.5)
WBC: 21.6 K/uL — ABNORMAL HIGH (ref 4.0–10.5)
nRBC: 18.9 % — ABNORMAL HIGH (ref 0.0–0.2)

## 2024-02-13 LAB — GLUCOSE, CAPILLARY
Glucose-Capillary: 101 mg/dL — ABNORMAL HIGH (ref 70–99)
Glucose-Capillary: 123 mg/dL — ABNORMAL HIGH (ref 70–99)
Glucose-Capillary: 126 mg/dL — ABNORMAL HIGH (ref 70–99)
Glucose-Capillary: 134 mg/dL — ABNORMAL HIGH (ref 70–99)
Glucose-Capillary: 98 mg/dL (ref 70–99)
Glucose-Capillary: 99 mg/dL (ref 70–99)

## 2024-02-13 LAB — TYPE AND SCREEN
ABO/RH(D): A POS
Antibody Screen: NEGATIVE
Unit division: 0

## 2024-02-13 LAB — BPAM RBC
Blood Product Expiration Date: 202512292359
ISSUE DATE / TIME: 202512150926
Unit Type and Rh: 6200

## 2024-02-13 LAB — BASIC METABOLIC PANEL WITH GFR
Anion gap: 8 (ref 5–15)
BUN: 25 mg/dL — ABNORMAL HIGH (ref 8–23)
CO2: 30 mmol/L (ref 22–32)
Calcium: 8 mg/dL — ABNORMAL LOW (ref 8.9–10.3)
Chloride: 108 mmol/L (ref 98–111)
Creatinine, Ser: 1.01 mg/dL — ABNORMAL HIGH (ref 0.44–1.00)
GFR, Estimated: 59 mL/min — ABNORMAL LOW
Glucose, Bld: 149 mg/dL — ABNORMAL HIGH (ref 70–99)
Potassium: 4.3 mmol/L (ref 3.5–5.1)
Sodium: 146 mmol/L — ABNORMAL HIGH (ref 135–145)

## 2024-02-13 LAB — TROPONIN I (HIGH SENSITIVITY)
Troponin I (High Sensitivity): 80 ng/L — ABNORMAL HIGH
Troponin I (High Sensitivity): 82 ng/L — ABNORMAL HIGH

## 2024-02-13 LAB — PHOSPHORUS: Phosphorus: 3.2 mg/dL (ref 2.5–4.6)

## 2024-02-13 LAB — CULTURE, RESPIRATORY W GRAM STAIN

## 2024-02-13 MED ORDER — OXYCODONE HCL 5 MG PO TABS
5.0000 mg | ORAL_TABLET | ORAL | Status: DC | PRN
  Administered 2024-02-13 (×2): 5 mg via ORAL
  Administered 2024-02-13: 7.5 mg via ORAL
  Administered 2024-02-14: 5 mg via ORAL
  Administered 2024-02-15: 7.5 mg via ORAL
  Administered 2024-02-15 – 2024-02-16 (×3): 5 mg via ORAL
  Administered 2024-02-16: 7.5 mg via ORAL
  Administered 2024-02-16: 5 mg via ORAL
  Administered 2024-02-16: 7.5 mg via ORAL
  Administered 2024-02-16 – 2024-02-17 (×2): 5 mg via ORAL
  Administered 2024-02-17 (×2): 7.5 mg via ORAL
  Administered 2024-02-17 – 2024-02-19 (×6): 5 mg via ORAL
  Administered 2024-02-19: 7.5 mg via ORAL
  Administered 2024-02-20 (×2): 5 mg via ORAL
  Administered 2024-02-20: 7.5 mg via ORAL
  Administered 2024-02-21 – 2024-02-26 (×14): 5 mg via ORAL

## 2024-02-13 MED ORDER — LIDOCAINE 5 % EX PTCH
2.0000 | MEDICATED_PATCH | CUTANEOUS | Status: DC
  Administered 2024-02-13 – 2024-02-25 (×13): 2 via TRANSDERMAL

## 2024-02-13 MED ORDER — TRAMADOL HCL 50 MG PO TABS
50.0000 mg | ORAL_TABLET | Freq: Four times a day (QID) | ORAL | Status: DC
  Administered 2024-02-13 – 2024-02-15 (×6): 50 mg

## 2024-02-13 MED ORDER — ACETAMINOPHEN 325 MG PO TABS: 650.0000 mg | ORAL_TABLET | Freq: Four times a day (QID) | ORAL | Status: DC | PRN

## 2024-02-13 MED ORDER — POLYETHYLENE GLYCOL 3350 17 G PO PACK: 17.0000 g | PACK | Freq: Every day | ORAL | Status: DC | PRN

## 2024-02-13 MED ADMIN — Methocarbamol Inj 1000 MG/10ML: 500 mg | INTRAVENOUS | NDC 55150022310

## 2024-02-13 MED ADMIN — Heparin Sodium (Porcine) Inj 5000 Unit/ML: 5000 [IU] | SUBCUTANEOUS | NDC 72572025501

## 2024-02-13 MED ADMIN — Piperacillin Sod-Tazobactam Sod in Dex IV Sol 3-0.375GM/50ML: 3.375 g | INTRAVENOUS | NDC 00338963601

## 2024-02-13 MED ADMIN — Oxycodone HCl Tab 5 MG: 7.5 mg | NDC 10702001850

## 2024-02-13 MED ADMIN — CEFEPIME 2 GM IVPB: 2 g | INTRAVENOUS | NDC 00409973501

## 2024-02-13 MED ADMIN — Guaifenesin Tab ER 12HR 600 MG: 1200 mg | ORAL | NDC 63824000850

## 2024-02-13 MED ADMIN — Guaifenesin Tab 200 MG: 600 mg | NDC 10135078901

## 2024-02-13 MED ADMIN — Nutritional Supplement Liquid: 1 | ORAL | NDC 4390018666

## 2024-02-13 MED ADMIN — Hydromorphone HCl Inj 1 MG/ML: 0.5 mg | INTRAVENOUS | NDC 76045000901

## 2024-02-13 MED ADMIN — Hydromorphone HCl Inj 1 MG/ML: 0.5 mg | INTRAVENOUS | NDC 76045000911

## 2024-02-13 MED ADMIN — Metoclopramide HCl Inj 5 MG/ML (Base Equivalent): 10 mg | INTRAVENOUS | NDC 00409341421

## 2024-02-13 MED ADMIN — Tramadol HCl Tab 50 MG: 50 mg | ORAL | NDC 60687079511
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
## 2024-02-13 NOTE — Plan of Care (Signed)
°  Problem: Clinical Measurements: Goal: Respiratory complications will improve Outcome: Not Progressing   Problem: Activity: Goal: Risk for activity intolerance will decrease Outcome: Not Progressing   Problem: Nutrition: Goal: Adequate nutrition will be maintained Outcome: Not Progressing   Problem: Pain Managment: Goal: General experience of comfort will improve and/or be controlled Outcome: Not Progressing   Problem: Skin Integrity: Goal: Risk for impaired skin integrity will decrease Outcome: Not Progressing

## 2024-02-13 NOTE — Progress Notes (Signed)
 RT NOTE: RT to room for scheduled CPT. PT is complaining of pain and nausea- she had vomiting episode early this AM. CPT held at this time.
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
## 2024-02-13 NOTE — Progress Notes (Signed)
 At approximately 0020, the patient's monitor started showing ST elevation despite the patient denying any chest pain. At 0022, an EKG was performed that showed sinus tachycardia - left ventricular hypertrophy with repolarization abnormality (R in aVL).   TRN notified at 0027-- TRN added troponin lab orders and advised to reach back out if there were additional status changes.   At 0255 the patient complained of chest pain that felt like it was going to explode. A new EKG was obtained at 0300 that showed normal sinus rhythm - left ventricular hypertrophy with QRS widening (R in aVL).   TRN notified at 0303 -- TRN advised to treat for pain and trend troponin.   Treated with dilaudid  at 0311. On reassessment, patient stated they had no pain.  At 0430 patient vomited, stating it came out of nowhere. Denied any nausea. NG tube turned to suction and 700 mL of green stomach contents were removed. Patient's SPO2 had previously been 98% at 4L [place] before vomiting, post emesis SPO2 rate was 91-92% after increasing O2 to 6L.   TRN notified at [phone]-- TRN advised to treat with Zofran  if patient complains of nausea, added an order for a stat CXR.

## 2024-02-14 ENCOUNTER — Other Ambulatory Visit: Payer: Self-pay

## 2024-02-14 ENCOUNTER — Inpatient Hospital Stay (HOSPITAL_COMMUNITY)

## 2024-02-14 LAB — BASIC METABOLIC PANEL WITH GFR
Anion gap: 12 (ref 5–15)
BUN: 28 mg/dL — ABNORMAL HIGH (ref 8–23)
CO2: 26 mmol/L (ref 22–32)
Calcium: 8.9 mg/dL (ref 8.9–10.3)
Chloride: 113 mmol/L — ABNORMAL HIGH (ref 98–111)
Creatinine, Ser: 1.07 mg/dL — ABNORMAL HIGH (ref 0.44–1.00)
GFR, Estimated: 55 mL/min — ABNORMAL LOW
Glucose, Bld: 112 mg/dL — ABNORMAL HIGH (ref 70–99)
Potassium: 5.4 mmol/L — ABNORMAL HIGH (ref 3.5–5.1)
Sodium: 151 mmol/L — ABNORMAL HIGH (ref 135–145)

## 2024-02-14 LAB — GLUCOSE, CAPILLARY
Glucose-Capillary: 103 mg/dL — ABNORMAL HIGH (ref 70–99)
Glucose-Capillary: 108 mg/dL — ABNORMAL HIGH (ref 70–99)
Glucose-Capillary: 128 mg/dL — ABNORMAL HIGH (ref 70–99)
Glucose-Capillary: 77 mg/dL (ref 70–99)
Glucose-Capillary: 92 mg/dL (ref 70–99)
Glucose-Capillary: 99 mg/dL (ref 70–99)

## 2024-02-14 LAB — CBC
HCT: 34.6 % — ABNORMAL LOW (ref 36.0–46.0)
Hemoglobin: 10.6 g/dL — ABNORMAL LOW (ref 12.0–15.0)
MCH: 28 pg (ref 26.0–34.0)
MCHC: 30.6 g/dL (ref 30.0–36.0)
MCV: 91.5 fL (ref 80.0–100.0)
Platelets: 334 K/uL (ref 150–400)
RBC: 3.78 MIL/uL — ABNORMAL LOW (ref 3.87–5.11)
RDW: 19.4 % — ABNORMAL HIGH (ref 11.5–15.5)
WBC: 25.4 K/uL — ABNORMAL HIGH (ref 4.0–10.5)
nRBC: 21 % — ABNORMAL HIGH (ref 0.0–0.2)

## 2024-02-14 LAB — CULTURE, BLOOD (ROUTINE X 2): Special Requests: ADEQUATE

## 2024-02-14 LAB — PHOSPHORUS: Phosphorus: 2.8 mg/dL (ref 2.5–4.6)

## 2024-02-14 LAB — TRIGLYCERIDES: Triglycerides: 189 mg/dL — ABNORMAL HIGH

## 2024-02-14 LAB — MAGNESIUM: Magnesium: 2.4 mg/dL (ref 1.7–2.4)

## 2024-02-14 MED ORDER — LACTATED RINGERS IV BOLUS
1000.0000 mL | Freq: Once | INTRAVENOUS | Status: AC
  Administered 2024-02-14: 1000 mL via INTRAVENOUS

## 2024-02-14 MED ORDER — DEXTROSE 50 % IV SOLN: 25.0000 mL | Freq: Once | INTRAVENOUS | Status: AC

## 2024-02-14 MED ORDER — SODIUM CHLORIDE 0.9% FLUSH: 10.0000 mL | INTRAVENOUS | Status: DC | PRN

## 2024-02-14 MED ORDER — SODIUM CHLORIDE 0.9% FLUSH
10.0000 mL | Freq: Two times a day (BID) | INTRAVENOUS | Status: DC
  Administered 2024-02-14: 10 mL
  Administered 2024-02-15: 20 mL
  Administered 2024-02-15 – 2024-02-16 (×2): 10 mL
  Administered 2024-02-16: 20 mL
  Administered 2024-02-17: 30 mL
  Administered 2024-02-17: 20 mL
  Administered 2024-02-18: 10 mL
  Administered 2024-02-18: 20 mL
  Administered 2024-02-19 – 2024-02-21 (×6): 10 mL
  Administered 2024-02-22: 20 mL
  Administered 2024-02-22: 10 mL
  Administered 2024-02-23: 20 mL
  Administered 2024-02-23 – 2024-02-25 (×4): 10 mL

## 2024-02-14 MED ADMIN — Heparin Sodium (Porcine) Inj 5000 Unit/ML: 5000 [IU] | SUBCUTANEOUS | NDC 72572025501

## 2024-02-14 MED ADMIN — Heparin Sodium (Porcine) Inj 5000 Unit/ML: 5000 [IU] | SUBCUTANEOUS | NDC 72572025525

## 2024-02-14 MED ADMIN — CEFEPIME 2 GM IVPB: 2 g | INTRAVENOUS | NDC 00409973501

## 2024-02-14 MED ADMIN — Guaifenesin Tab 200 MG: 600 mg | NDC 10135078901

## 2024-02-14 MED ADMIN — Insulin Aspart Inj Soln 100 Unit/ML: 1 [IU] | SUBCUTANEOUS | NDC 73070010011

## 2024-02-14 MED ADMIN — Hydromorphone HCl Inj 1 MG/ML: 0.5 mg | INTRAVENOUS | NDC 76045000901

## 2024-02-14 MED ADMIN — Dextrose Inj 50%: 25 mL | INTRAVENOUS | NDC 76329330201

## 2024-02-14 MED ADMIN — Iohexol Oral Soln 9 MG/ML: 500 mL | ORAL | NDC 00407141509

## 2024-02-14 MED ADMIN — Iohexol IV Soln 350 MG/ML: 75 mL | INTRAVENOUS | NDC 00407141490
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
## 2024-02-14 NOTE — Plan of Care (Signed)
°  Problem: Education: Goal: Knowledge of General Education information will improve Description: Including pain rating scale, medication(s)/side effects and non-pharmacologic comfort measures Outcome: Progressing   Problem: Activity: Goal: Risk for activity intolerance will decrease Outcome: Progressing   Problem: Coping: Goal: Level of anxiety will decrease Outcome: Progressing   Problem: Elimination: Goal: Will not experience complications related to bowel motility Outcome: Progressing   Problem: Pain Managment: Goal: General experience of comfort will improve and/or be controlled Outcome: Progressing   Problem: Nutrition: Goal: Adequate nutrition will be maintained Outcome: Not Progressing

## 2024-02-14 NOTE — Progress Notes (Signed)
 Peripherally Inserted Central Catheter Placement  The IV Nurse has discussed with the patient and/or persons authorized to consent for the patient, the purpose of this procedure and the potential benefits and risks involved with this procedure.  The benefits include less needle sticks, lab draws from the catheter, and the patient may be discharged home with the catheter. Risks include, but not limited to, infection, bleeding, blood clot (thrombus formation), and puncture of an artery; nerve damage and irregular heartbeat and possibility to perform a PICC exchange if needed/ordered by physician.  Alternatives to this procedure were also discussed.  Bard Power PICC patient education guide, fact sheet on infection prevention and patient information card has been provided to patient /or left at bedside.  Daughter at bedside and signed consent for PICC placement.  PICC Placement Documentation  PICC Double Lumen 02/14/24 Right Brachial 40 cm 0 cm (Active)  Indication for Insertion or Continuance of Line Administration of hyperosmolar/irritating solutions (i.e. TPN, Vancomycin, etc.) 02/14/24 1505  Exposed Catheter (cm) 0 cm 02/14/24 1505  Site Assessment Clean, Dry, Intact 02/14/24 1505  Lumen #1 Status Flushed;Saline locked;Blood return noted 02/14/24 1505  Lumen #2 Status Flushed;Saline locked;Blood return noted 02/14/24 1505  Dressing Type Transparent;Securing device 02/14/24 1505  Dressing Status Antimicrobial disc/dressing in place 02/14/24 1505  Line Care Connections checked and tightened 02/14/24 1505  Line Adjustment (NICU/IV Team Only) No 02/14/24 1505  Dressing Intervention New dressing;Adhesive placed at insertion site (IV team only) 02/14/24 1505  Dressing Change Due 02/21/24 02/14/24 1505       Leita  Jonh Mcqueary 02/14/2024, 3:08 PM

## 2024-02-15 LAB — COMPREHENSIVE METABOLIC PANEL WITH GFR
ALT: 18 U/L (ref 0–44)
AST: 45 U/L — ABNORMAL HIGH (ref 15–41)
Albumin: 3.1 g/dL — ABNORMAL LOW (ref 3.5–5.0)
Alkaline Phosphatase: 113 U/L (ref 38–126)
Anion gap: 9 (ref 5–15)
BUN: 22 mg/dL (ref 8–23)
CO2: 29 mmol/L (ref 22–32)
Calcium: 8.7 mg/dL — ABNORMAL LOW (ref 8.9–10.3)
Chloride: 111 mmol/L (ref 98–111)
Creatinine, Ser: 0.83 mg/dL (ref 0.44–1.00)
GFR, Estimated: >60 mL/min
Glucose, Bld: 128 mg/dL — ABNORMAL HIGH (ref 70–99)
Potassium: 4.7 mmol/L (ref 3.5–5.1)
Sodium: 148 mmol/L — ABNORMAL HIGH (ref 135–145)
Total Bilirubin: 1.3 mg/dL — ABNORMAL HIGH (ref 0.0–1.2)
Total Protein: 6.1 g/dL — ABNORMAL LOW (ref 6.5–8.1)

## 2024-02-15 LAB — CBC
HCT: 31.8 % — ABNORMAL LOW (ref 36.0–46.0)
Hemoglobin: 9.6 g/dL — ABNORMAL LOW (ref 12.0–15.0)
MCH: 28 pg (ref 26.0–34.0)
MCHC: 30.2 g/dL (ref 30.0–36.0)
MCV: 92.7 fL (ref 80.0–100.0)
Platelets: 270 K/uL (ref 150–400)
RBC: 3.43 MIL/uL — ABNORMAL LOW (ref 3.87–5.11)
RDW: 19.7 % — ABNORMAL HIGH (ref 11.5–15.5)
WBC: 26.5 K/uL — ABNORMAL HIGH (ref 4.0–10.5)
nRBC: 7.9 % — ABNORMAL HIGH (ref 0.0–0.2)

## 2024-02-15 LAB — GLUCOSE, CAPILLARY
Glucose-Capillary: 108 mg/dL — ABNORMAL HIGH (ref 70–99)
Glucose-Capillary: 119 mg/dL — ABNORMAL HIGH (ref 70–99)
Glucose-Capillary: 128 mg/dL — ABNORMAL HIGH (ref 70–99)
Glucose-Capillary: 130 mg/dL — ABNORMAL HIGH (ref 70–99)
Glucose-Capillary: 135 mg/dL — ABNORMAL HIGH (ref 70–99)
Glucose-Capillary: 137 mg/dL — ABNORMAL HIGH (ref 70–99)

## 2024-02-15 LAB — CULTURE, BLOOD (ROUTINE X 2)
Culture: NO GROWTH
Special Requests: ADEQUATE

## 2024-02-15 LAB — HEMOGLOBIN A1C
Hgb A1c MFr Bld: 5.7 % — ABNORMAL HIGH (ref 4.8–5.6)
Mean Plasma Glucose: 116.89 mg/dL

## 2024-02-15 LAB — MAGNESIUM: Magnesium: 2.3 mg/dL (ref 1.7–2.4)

## 2024-02-15 LAB — PHOSPHORUS: Phosphorus: 2.8 mg/dL (ref 2.5–4.6)

## 2024-02-15 MED ORDER — TRAMADOL HCL 50 MG PO TABS
100.0000 mg | ORAL_TABLET | Freq: Four times a day (QID) | ORAL | Status: DC
  Administered 2024-02-15 – 2024-02-18 (×11): 100 mg

## 2024-02-15 MED ADMIN — Heparin Sodium (Porcine) Inj 5000 Unit/ML: 5000 [IU] | SUBCUTANEOUS | NDC 72572025501

## 2024-02-15 MED ADMIN — CEFEPIME 2 GM IVPB: 2 g | INTRAVENOUS | NDC 00409973501

## 2024-02-15 MED ADMIN — Guaifenesin Tab 200 MG: 600 mg | NDC 10135078901

## 2024-02-15 MED ADMIN — Methocarbamol Inj 1000 MG/10ML: 1000 mg | INTRAVENOUS | NDC 55150022310

## 2024-02-15 MED ADMIN — Insulin Aspart Inj Soln 100 Unit/ML: 1 [IU] | SUBCUTANEOUS | NDC 73070010011

## 2024-02-15 MED ADMIN — Hydromorphone HCl Inj 1 MG/ML: 0.5 mg | INTRAVENOUS | NDC 76045000901

## 2024-02-15 MED ADMIN — Hydromorphone HCl Inj 1 MG/ML: 0.5 mg | INTRAVENOUS | NDC 76045000911
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
## 2024-02-15 NOTE — Plan of Care (Signed)
°  Problem: Education: Goal: Knowledge of General Education information will improve Description: Including pain rating scale, medication(s)/side effects and non-pharmacologic comfort measures Outcome: Progressing   Problem: Clinical Measurements: Goal: Ability to maintain clinical measurements within normal limits will improve Outcome: Progressing Goal: Diagnostic test results will improve Outcome: Progressing Goal: Respiratory complications will improve Outcome: Progressing Goal: Cardiovascular complication will be avoided Outcome: Progressing   Problem: Nutrition: Goal: Adequate nutrition will be maintained Outcome: Progressing   Problem: Coping: Goal: Level of anxiety will decrease Outcome: Progressing   Problem: Elimination: Goal: Will not experience complications related to bowel motility Outcome: Progressing   Problem: Pain Managment: Goal: General experience of comfort will improve and/or be controlled Outcome: Progressing   Problem: Skin Integrity: Goal: Risk for impaired skin integrity will decrease Outcome: Progressing   Problem: Health Behavior/Discharge Planning: Goal: Ability to manage health-related needs will improve Outcome: Not Progressing   Problem: Activity: Goal: Risk for activity intolerance will decrease Outcome: Not Progressing   Problem: Elimination: Goal: Will not experience complications related to urinary retention Outcome: Not Applicable Note: Foley in place

## 2024-02-15 NOTE — TOC Progression Note (Signed)
 Transition of Care [facility]) - Progression Note    Patient Details  Name: Theresa Villarreal MRN: [id number] Date of Birth: January 01, 1951  Transition of Care [facility]) CM/SW Contact  Chardonay [place] E Castle Lamons, LCSW Phone Number: 02/15/2024, 11:56 AM  Clinical Narrative:    ICM continues to follow for DC planning when medically stable.     Barriers to Discharge: Continued Medical Work up               Expected Discharge Plan and Services       Living arrangements for the past 2 months: Single Family Home                                       Social Drivers of Health (SDOH) Interventions SDOH Screenings   Tobacco Use: Low Risk (02/07/2024)    Readmission Risk Interventions     No data to display

## 2024-02-16 ENCOUNTER — Inpatient Hospital Stay (HOSPITAL_COMMUNITY)

## 2024-02-16 DIAGNOSIS — M7989 Other specified soft tissue disorders: Secondary | ICD-10-CM

## 2024-02-16 DIAGNOSIS — R609 Edema, unspecified: Secondary | ICD-10-CM | POA: Diagnosis not present

## 2024-02-16 LAB — CBC
HCT: 28.7 % — ABNORMAL LOW (ref 36.0–46.0)
Hemoglobin: 8.6 g/dL — ABNORMAL LOW (ref 12.0–15.0)
MCH: 29.1 pg (ref 26.0–34.0)
MCHC: 30 g/dL (ref 30.0–36.0)
MCV: 97 fL (ref 80.0–100.0)
Platelets: 268 K/uL (ref 150–400)
RBC: 2.96 MIL/uL — ABNORMAL LOW (ref 3.87–5.11)
RDW: 19.2 % — ABNORMAL HIGH (ref 11.5–15.5)
WBC: 27.1 K/uL — ABNORMAL HIGH (ref 4.0–10.5)
nRBC: 5.4 % — ABNORMAL HIGH (ref 0.0–0.2)

## 2024-02-16 LAB — GLUCOSE, CAPILLARY
Glucose-Capillary: 138 mg/dL — ABNORMAL HIGH (ref 70–99)
Glucose-Capillary: 133 mg/dL — ABNORMAL HIGH (ref 70–99)
Glucose-Capillary: 115 mg/dL — ABNORMAL HIGH (ref 70–99)
Glucose-Capillary: 118 mg/dL — ABNORMAL HIGH (ref 70–99)
Glucose-Capillary: 150 mg/dL — ABNORMAL HIGH (ref 70–99)

## 2024-02-16 LAB — BASIC METABOLIC PANEL WITH GFR
Anion gap: 8 (ref 5–15)
BUN: 21 mg/dL (ref 8–23)
CO2: 29 mmol/L (ref 22–32)
Calcium: 8.4 mg/dL — ABNORMAL LOW (ref 8.9–10.3)
Chloride: 114 mmol/L — ABNORMAL HIGH (ref 98–111)
Creatinine, Ser: 0.75 mg/dL (ref 0.44–1.00)
GFR, Estimated: >60 mL/min
Glucose, Bld: 148 mg/dL — ABNORMAL HIGH (ref 70–99)
Potassium: 3.7 mmol/L (ref 3.5–5.1)
Sodium: 151 mmol/L — ABNORMAL HIGH (ref 135–145)

## 2024-02-16 LAB — MAGNESIUM: Magnesium: 2.1 mg/dL (ref 1.7–2.4)

## 2024-02-16 LAB — PHOSPHORUS: Phosphorus: 2.5 mg/dL (ref 2.5–4.6)

## 2024-02-16 MED ADMIN — Heparin Sodium (Porcine) Inj 5000 Unit/ML: 5000 [IU] | SUBCUTANEOUS | NDC 72572025501

## 2024-02-16 MED ADMIN — CEFEPIME 2 GM IVPB: 2 g | INTRAVENOUS | NDC 00409973501

## 2024-02-16 MED ADMIN — Buspirone HCl Tab 10 MG: 10 mg | ORAL | NDC 00093005405

## 2024-02-16 MED ADMIN — Guaifenesin Tab 200 MG: 600 mg | NDC 10135078901

## 2024-02-16 MED ADMIN — Methocarbamol Inj 1000 MG/10ML: 1000 mg | INTRAVENOUS | NDC 55150022310

## 2024-02-16 MED ADMIN — Insulin Aspart Inj Soln 100 Unit/ML: 1 [IU] | SUBCUTANEOUS | NDC 73070010011

## 2024-02-16 MED ADMIN — Hydromorphone HCl Inj 1 MG/ML: 0.5 mg | INTRAVENOUS | NDC 76045000901

## 2024-02-16 MED ADMIN — Iohexol IV Soln 350 MG/ML: 75 mL | INTRAVENOUS | NDC 00407141490

## 2024-02-16 MED ADMIN — Enoxaparin Sodium Inj Soln Pref Syr 120 MG/0.8ML: 110 mg | SUBCUTANEOUS | NDC 71839011510

## 2024-02-16 MED ADMIN — Ipratropium-Albuterol Nebu Soln 0.5-2.5(3) MG/3ML: 3 mL | NDC 76204060001

## 2024-02-16 MED ADMIN — Enoxaparin Sodium Inj Soln Pref Syr 40 MG/0.4ML: 40 mg | SUBCUTANEOUS | NDC 00781324602
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
## 2024-02-16 NOTE — Progress Notes (Signed)
 Patient ID: Theresa Villarreal, female   DOB: [birth year], 73 y.o.   MRN: [id number] Increased WOB after working with therapies. Sats good on 3L. Getting duoneb now. Duplex BLE now as well. Will check CXR. Consider CTA chest.  Dann Hummer, MD, MPH, FACS Please use [email or web address] to contact on call provider

## 2024-02-16 NOTE — Plan of Care (Signed)
" °  Problem: Education: Goal: Knowledge of General Education information will improve Description: Including pain rating scale, medication(s)/side effects and non-pharmacologic comfort measures Outcome: Progressing   Problem: Clinical Measurements: Goal: Ability to maintain clinical measurements within normal limits will improve Outcome: Progressing Goal: Respiratory complications will improve Outcome: Progressing   Problem: Coping: Goal: Level of anxiety will decrease Outcome: Progressing   Problem: Pain Managment: Goal: General experience of comfort will improve and/or be controlled Outcome: Progressing   Problem: Skin Integrity: Goal: Risk for impaired skin integrity will decrease Outcome: Progressing   Problem: Fluid Volume: Goal: Hemodynamic stability will improve Outcome: Progressing   Problem: Respiratory: Goal: Ability to maintain adequate ventilation will improve Outcome: Progressing   Problem: Coping: Goal: Ability to adjust to condition or change in health will improve Outcome: Progressing   Problem: Health Behavior/Discharge Planning: Goal: Ability to manage health-related needs will improve Outcome: Progressing   Problem: Metabolic: Goal: Ability to maintain appropriate glucose levels will improve Outcome: Progressing   Problem: Nutritional: Goal: Maintenance of adequate nutrition will improve Outcome: Progressing Note: TPN    Problem: Tissue Perfusion: Goal: Adequacy of tissue perfusion will improve Outcome: Progressing   Problem: Health Behavior/Discharge Planning: Goal: Ability to manage health-related needs will improve Outcome: Not Progressing   Problem: Activity: Goal: Risk for activity intolerance will decrease Outcome: Not Progressing   Problem: Skin Integrity: Goal: Risk for impaired skin integrity will decrease Outcome: Not Progressing   "
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
## 2024-02-16 NOTE — Progress Notes (Signed)
 BLE venous duplex has been completed.  Preliminary findings given to Dr. Paola.   Results can be found under chart review under CV PROC. 02/16/2024 2:45 PM Kirill Chatterjee RVT, RDMS
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
## 2024-02-16 NOTE — Progress Notes (Signed)
 Patient ID: Theresa Villarreal, female   DOB: 12/20/1950, 73 y.o.   MRN: [id number] Pt. Reports feeling a little better. Duplex + small DVT Full dose Lovenox  started. Will get CTA chest to eval for PE.  I spoke with her daughter and son.  Dann Hummer, MD, MPH, FACS Please use [email or web address] to contact on call provider

## 2024-02-17 LAB — MAGNESIUM
Magnesium: 1.9 mg/dL (ref 1.7–2.4)
Magnesium: 1.9 mg/dL (ref 1.7–2.4)

## 2024-02-17 LAB — BASIC METABOLIC PANEL WITH GFR
Anion gap: 6 (ref 5–15)
Anion gap: 7 (ref 5–15)
BUN: 20 mg/dL (ref 8–23)
BUN: 20 mg/dL (ref 8–23)
CO2: 26 mmol/L (ref 22–32)
CO2: 26 mmol/L (ref 22–32)
Calcium: 8.7 mg/dL — ABNORMAL LOW (ref 8.9–10.3)
Calcium: 8.4 mg/dL — ABNORMAL LOW (ref 8.9–10.3)
Chloride: 100 mmol/L (ref 98–111)
Chloride: 104 mmol/L (ref 98–111)
Creatinine, Ser: 1.04 mg/dL — ABNORMAL HIGH (ref 0.44–1.00)
Creatinine, Ser: 0.77 mg/dL (ref 0.44–1.00)
GFR, Estimated: 57 mL/min — ABNORMAL LOW
GFR, Estimated: >60 mL/min
Glucose, Bld: 758 mg/dL (ref 70–99)
Glucose, Bld: 561 mg/dL (ref 70–99)
Potassium: 4.4 mmol/L (ref 3.5–5.1)
Potassium: 4.1 mmol/L (ref 3.5–5.1)
Sodium: 132 mmol/L — ABNORMAL LOW (ref 135–145)
Sodium: 137 mmol/L (ref 135–145)

## 2024-02-17 LAB — CBC
HCT: 28.5 % — ABNORMAL LOW (ref 36.0–46.0)
HCT: 26.8 % — ABNORMAL LOW (ref 36.0–46.0)
Hemoglobin: 8.6 g/dL — ABNORMAL LOW (ref 12.0–15.0)
Hemoglobin: 8.2 g/dL — ABNORMAL LOW (ref 12.0–15.0)
MCH: 30.9 pg (ref 26.0–34.0)
MCH: 29.9 pg (ref 26.0–34.0)
MCHC: 30.2 g/dL (ref 30.0–36.0)
MCHC: 30.6 g/dL (ref 30.0–36.0)
MCV: 102.5 fL — ABNORMAL HIGH (ref 80.0–100.0)
MCV: 97.8 fL (ref 80.0–100.0)
Platelets: 280 K/uL (ref 150–400)
Platelets: 302 K/uL (ref 150–400)
RBC: 2.78 MIL/uL — ABNORMAL LOW (ref 3.87–5.11)
RBC: 2.74 MIL/uL — ABNORMAL LOW (ref 3.87–5.11)
RDW: 20.2 % — ABNORMAL HIGH (ref 11.5–15.5)
RDW: 19.9 % — ABNORMAL HIGH (ref 11.5–15.5)
WBC: 28.9 K/uL — ABNORMAL HIGH (ref 4.0–10.5)
WBC: 27.5 K/uL — ABNORMAL HIGH (ref 4.0–10.5)
nRBC: 3.2 % — ABNORMAL HIGH (ref 0.0–0.2)
nRBC: 3.3 % — ABNORMAL HIGH (ref 0.0–0.2)

## 2024-02-17 LAB — GLUCOSE, CAPILLARY
Glucose-Capillary: 127 mg/dL — ABNORMAL HIGH (ref 70–99)
Glucose-Capillary: 128 mg/dL — ABNORMAL HIGH (ref 70–99)
Glucose-Capillary: 133 mg/dL — ABNORMAL HIGH (ref 70–99)
Glucose-Capillary: 137 mg/dL — ABNORMAL HIGH (ref 70–99)
Glucose-Capillary: 145 mg/dL — ABNORMAL HIGH (ref 70–99)
Glucose-Capillary: 162 mg/dL — ABNORMAL HIGH (ref 70–99)

## 2024-02-17 LAB — PHOSPHORUS
Phosphorus: 4.7 mg/dL — ABNORMAL HIGH (ref 2.5–4.6)
Phosphorus: 3.5 mg/dL (ref 2.5–4.6)

## 2024-02-17 MED ADMIN — CEFEPIME 2 GM IVPB: 2 g | INTRAVENOUS | NDC 00409973501

## 2024-02-17 MED ADMIN — Buspirone HCl Tab 10 MG: 10 mg | ORAL | NDC 00093005405

## 2024-02-17 MED ADMIN — Guaifenesin Tab 200 MG: 600 mg | NDC 10135078901

## 2024-02-17 MED ADMIN — Methocarbamol Inj 1000 MG/10ML: 1000 mg | INTRAVENOUS | NDC 55150022310

## 2024-02-17 MED ADMIN — Insulin Aspart Inj Soln 100 Unit/ML: 2 [IU] | SUBCUTANEOUS | NDC 73070010011

## 2024-02-17 MED ADMIN — Insulin Aspart Inj Soln 100 Unit/ML: 1 [IU] | SUBCUTANEOUS | NDC 73070010011

## 2024-02-17 MED ADMIN — Hydromorphone HCl Inj 1 MG/ML: 0.5 mg | INTRAVENOUS | NDC 76045000901

## 2024-02-17 MED ADMIN — Enoxaparin Sodium Inj Soln Pref Syr 120 MG/0.8ML: 110 mg | SUBCUTANEOUS | NDC 71839011510

## 2024-02-17 MED ADMIN — Magnesium Sulfate IV Soln 2 GM/50ML (40 MG/ML): 2 g | INTRAVENOUS | NDC 00409672911
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
## 2024-02-18 LAB — CBC
HCT: 28.8 % — ABNORMAL LOW (ref 36.0–46.0)
Hemoglobin: 8.8 g/dL — ABNORMAL LOW (ref 12.0–15.0)
MCH: 29 pg (ref 26.0–34.0)
MCHC: 30.6 g/dL (ref 30.0–36.0)
MCV: 95 fL (ref 80.0–100.0)
Platelets: 388 K/uL (ref 150–400)
RBC: 3.03 MIL/uL — ABNORMAL LOW (ref 3.87–5.11)
RDW: 19.8 % — ABNORMAL HIGH (ref 11.5–15.5)
WBC: 27.6 K/uL — ABNORMAL HIGH (ref 4.0–10.5)
nRBC: 2.7 % — ABNORMAL HIGH (ref 0.0–0.2)

## 2024-02-18 LAB — MAGNESIUM: Magnesium: 2.2 mg/dL (ref 1.7–2.4)

## 2024-02-18 LAB — GLUCOSE, CAPILLARY
Glucose-Capillary: 109 mg/dL — ABNORMAL HIGH (ref 70–99)
Glucose-Capillary: 124 mg/dL — ABNORMAL HIGH (ref 70–99)
Glucose-Capillary: 125 mg/dL — ABNORMAL HIGH (ref 70–99)
Glucose-Capillary: 130 mg/dL — ABNORMAL HIGH (ref 70–99)
Glucose-Capillary: 133 mg/dL — ABNORMAL HIGH (ref 70–99)
Glucose-Capillary: 141 mg/dL — ABNORMAL HIGH (ref 70–99)
Glucose-Capillary: 156 mg/dL — ABNORMAL HIGH (ref 70–99)

## 2024-02-18 LAB — BASIC METABOLIC PANEL WITH GFR
Anion gap: 8 (ref 5–15)
BUN: 23 mg/dL (ref 8–23)
CO2: 27 mmol/L (ref 22–32)
Calcium: 8.5 mg/dL — ABNORMAL LOW (ref 8.9–10.3)
Chloride: 107 mmol/L (ref 98–111)
Creatinine, Ser: 0.77 mg/dL (ref 0.44–1.00)
GFR, Estimated: >60 mL/min
Glucose, Bld: 136 mg/dL — ABNORMAL HIGH (ref 70–99)
Potassium: 3.3 mmol/L — ABNORMAL LOW (ref 3.5–5.1)
Sodium: 141 mmol/L (ref 135–145)

## 2024-02-18 MED ORDER — METOCLOPRAMIDE HCL 5 MG/ML IJ SOLN: 5.0000 mg | Freq: Three times a day (TID) | INTRAMUSCULAR | Status: DC | PRN

## 2024-02-18 MED ORDER — METOCLOPRAMIDE HCL 5 MG PO TABS: 5.0000 mg | ORAL_TABLET | Freq: Three times a day (TID) | ORAL | Status: DC | PRN

## 2024-02-18 MED ORDER — TRAMADOL HCL 50 MG PO TABS
100.0000 mg | ORAL_TABLET | Freq: Four times a day (QID) | ORAL | Status: DC
  Administered 2024-02-18 – 2024-02-26 (×33): 100 mg via ORAL

## 2024-02-18 MED ORDER — GUAIFENESIN 200 MG PO TABS
600.0000 mg | ORAL_TABLET | Freq: Four times a day (QID) | ORAL | Status: DC
  Administered 2024-02-18 – 2024-02-26 (×33): 600 mg via ORAL

## 2024-02-18 MED ADMIN — CEFEPIME 2 GM IVPB: 2 g | INTRAVENOUS | NDC 00409973501

## 2024-02-18 MED ADMIN — Buspirone HCl Tab 10 MG: 10 mg | ORAL | NDC 00093005405

## 2024-02-18 MED ADMIN — Guaifenesin Tab 200 MG: 600 mg | NDC 10135078901

## 2024-02-18 MED ADMIN — Methocarbamol Inj 1000 MG/10ML: 1000 mg | INTRAVENOUS | NDC 55150022310

## 2024-02-18 MED ADMIN — Potassium Chloride Inj 10 mEq/50ML: 10 meq | INTRAVENOUS | NDC 00338070541

## 2024-02-18 MED ADMIN — Insulin Aspart Inj Soln 100 Unit/ML: 1 [IU] | SUBCUTANEOUS | NDC 73070010011

## 2024-02-18 MED ADMIN — Insulin Aspart Inj Soln 100 Unit/ML: 2 [IU] | SUBCUTANEOUS | NDC 73070010011

## 2024-02-18 MED ADMIN — Hydromorphone HCl Inj 1 MG/ML: 0.5 mg | INTRAVENOUS | NDC 76045000901

## 2024-02-18 MED ADMIN — Enoxaparin Sodium Inj Soln Pref Syr 120 MG/0.8ML: 110 mg | SUBCUTANEOUS | NDC 71839011510

## 2024-02-18 MED ADMIN — Potassium Chloride Microencapsulated Crys ER Tab 20 mEq: 40 meq | ORAL | NDC 00245531989
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
## 2024-02-19 LAB — COMPREHENSIVE METABOLIC PANEL WITH GFR
ALT: 27 U/L (ref 0–44)
AST: 43 U/L — ABNORMAL HIGH (ref 15–41)
Albumin: 2.7 g/dL — ABNORMAL LOW (ref 3.5–5.0)
Alkaline Phosphatase: 131 U/L — ABNORMAL HIGH (ref 38–126)
Anion gap: 7 (ref 5–15)
BUN: 24 mg/dL — ABNORMAL HIGH (ref 8–23)
CO2: 25 mmol/L (ref 22–32)
Calcium: 8.6 mg/dL — ABNORMAL LOW (ref 8.9–10.3)
Chloride: 108 mmol/L (ref 98–111)
Creatinine, Ser: 0.71 mg/dL (ref 0.44–1.00)
GFR, Estimated: >60 mL/min
Glucose, Bld: 147 mg/dL — ABNORMAL HIGH (ref 70–99)
Potassium: 3.7 mmol/L (ref 3.5–5.1)
Sodium: 140 mmol/L (ref 135–145)
Total Bilirubin: 0.9 mg/dL (ref 0.0–1.2)
Total Protein: 6 g/dL — ABNORMAL LOW (ref 6.5–8.1)

## 2024-02-19 LAB — GLUCOSE, CAPILLARY
Glucose-Capillary: 120 mg/dL — ABNORMAL HIGH (ref 70–99)
Glucose-Capillary: 116 mg/dL — ABNORMAL HIGH (ref 70–99)
Glucose-Capillary: 133 mg/dL — ABNORMAL HIGH (ref 70–99)
Glucose-Capillary: 134 mg/dL — ABNORMAL HIGH (ref 70–99)
Glucose-Capillary: 124 mg/dL — ABNORMAL HIGH (ref 70–99)

## 2024-02-19 LAB — PHOSPHORUS: Phosphorus: 1.9 mg/dL — ABNORMAL LOW (ref 2.5–4.6)

## 2024-02-19 LAB — MAGNESIUM: Magnesium: 2.1 mg/dL (ref 1.7–2.4)

## 2024-02-19 LAB — TRIGLYCERIDES: Triglycerides: 116 mg/dL

## 2024-02-19 MED ORDER — ADULT MULTIVITAMIN W/MINERALS CH
1.0000 | ORAL_TABLET | Freq: Every day | ORAL | Status: DC
  Administered 2024-02-20 – 2024-02-26 (×7): 1 via ORAL

## 2024-02-19 MED ORDER — BOOST / RESOURCE BREEZE PO LIQD CUSTOM
1.0000 | Freq: Three times a day (TID) | ORAL | Status: DC
  Administered 2024-02-19 – 2024-02-26 (×19): 1 via ORAL

## 2024-02-19 MED ADMIN — CEFEPIME 2 GM IVPB: 2 g | INTRAVENOUS | NDC 00409973501

## 2024-02-19 MED ADMIN — Buspirone HCl Tab 10 MG: 10 mg | ORAL | NDC 00093005405

## 2024-02-19 MED ADMIN — Methocarbamol Inj 1000 MG/10ML: 1000 mg | INTRAVENOUS | NDC 55150022310

## 2024-02-19 MED ADMIN — Methocarbamol Inj 1000 MG/10ML: 1000 mg | INTRAVENOUS | NDC 71288071611

## 2024-02-19 MED ADMIN — Insulin Aspart Inj Soln 100 Unit/ML: 1 [IU] | SUBCUTANEOUS | NDC 73070010011

## 2024-02-19 MED ADMIN — Hydromorphone HCl Inj 1 MG/ML: 0.5 mg | INTRAVENOUS | NDC 76045000901

## 2024-02-19 MED ADMIN — Enoxaparin Sodium Inj Soln Pref Syr 120 MG/0.8ML: 110 mg | SUBCUTANEOUS | NDC 71839011510

## 2024-02-19 MED ADMIN — Furosemide Inj 10 MG/ML: 40 mg | INTRAVENOUS | NDC 71288020304

## 2024-02-19 MED ADMIN — POTASSIUM PHOSPHATE HIGH DOSE (MMOL) IVPB: 30 mmol | INTRAVENOUS | NDC 80830169301
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
## 2024-02-19 NOTE — TOC Progression Note (Signed)
 Transition of Care [facility]) - Progression Note    Patient Details  Name: Theresa Villarreal MRN: [id number] Date of Birth: 1950-08-23  [facility] CM/SW Contact  Abrish Erny E Dontai Pember, LCSW Phone Number: 02/19/2024, 12:02 PM  Clinical Narrative:    ICM continues to follow, can send out for SNF once patient is off of TPN.     Barriers to Discharge: Continued Medical Work up               Expected Discharge Plan and Services       Living arrangements for the past 2 months: Single Family Home                                       Social Drivers of Health (SDOH) Interventions SDOH Screenings   Tobacco Use: Low Risk (02/07/2024)    Readmission Risk Interventions     No data to display
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
## 2024-02-20 ENCOUNTER — Inpatient Hospital Stay (HOSPITAL_COMMUNITY)

## 2024-02-20 LAB — CBC
HCT: 27.5 % — ABNORMAL LOW (ref 36.0–46.0)
Hemoglobin: 8.1 g/dL — ABNORMAL LOW (ref 12.0–15.0)
MCH: 28.5 pg (ref 26.0–34.0)
MCHC: 29.5 g/dL — ABNORMAL LOW (ref 30.0–36.0)
MCV: 96.8 fL (ref 80.0–100.0)
Platelets: 488 K/uL — ABNORMAL HIGH (ref 150–400)
RBC: 2.84 MIL/uL — ABNORMAL LOW (ref 3.87–5.11)
RDW: 20.4 % — ABNORMAL HIGH (ref 11.5–15.5)
WBC: 21.3 K/uL — ABNORMAL HIGH (ref 4.0–10.5)
nRBC: 2.1 % — ABNORMAL HIGH (ref 0.0–0.2)

## 2024-02-20 LAB — GLUCOSE, CAPILLARY
Glucose-Capillary: 102 mg/dL — ABNORMAL HIGH (ref 70–99)
Glucose-Capillary: 105 mg/dL — ABNORMAL HIGH (ref 70–99)
Glucose-Capillary: 111 mg/dL — ABNORMAL HIGH (ref 70–99)
Glucose-Capillary: 115 mg/dL — ABNORMAL HIGH (ref 70–99)
Glucose-Capillary: 116 mg/dL — ABNORMAL HIGH (ref 70–99)
Glucose-Capillary: 153 mg/dL — ABNORMAL HIGH (ref 70–99)
Glucose-Capillary: 96 mg/dL (ref 70–99)

## 2024-02-20 LAB — BASIC METABOLIC PANEL WITH GFR
Anion gap: 9 (ref 5–15)
BUN: 25 mg/dL — ABNORMAL HIGH (ref 8–23)
CO2: 25 mmol/L (ref 22–32)
Calcium: 8.6 mg/dL — ABNORMAL LOW (ref 8.9–10.3)
Chloride: 107 mmol/L (ref 98–111)
Creatinine, Ser: 0.78 mg/dL (ref 0.44–1.00)
GFR, Estimated: >60 mL/min
Glucose, Bld: 111 mg/dL — ABNORMAL HIGH (ref 70–99)
Potassium: 4.3 mmol/L (ref 3.5–5.1)
Sodium: 140 mmol/L (ref 135–145)

## 2024-02-20 LAB — URINALYSIS, COMPLETE (UACMP) WITH MICROSCOPIC
Bilirubin Urine: NEGATIVE
Glucose, UA: NEGATIVE mg/dL
Ketones, ur: NEGATIVE mg/dL
Leukocytes,Ua: NEGATIVE
Nitrite: NEGATIVE
Protein, ur: 30 mg/dL — AB
RBC / HPF: >50 RBC/hpf (ref 0–5)
Specific Gravity, Urine: 1.014 (ref 1.005–1.030)
pH: 6 (ref 5.0–8.0)

## 2024-02-20 LAB — PHOSPHORUS: Phosphorus: 3.3 mg/dL (ref 2.5–4.6)

## 2024-02-20 LAB — TROPONIN T, HIGH SENSITIVITY
Troponin T High Sensitivity: 47 ng/L — ABNORMAL HIGH (ref 0–19)
Troponin T High Sensitivity: 56 ng/L — ABNORMAL HIGH (ref 0–19)

## 2024-02-20 MED ORDER — PNEUMOCOCCAL 20-VAL CONJ VACC 0.5 ML IM SUSY
0.5000 mL | PREFILLED_SYRINGE | Freq: Once | INTRAMUSCULAR | Status: AC
  Administered 2024-02-21: 0.5 mL via INTRAMUSCULAR

## 2024-02-20 MED ORDER — INSULIN ASPART 100 UNIT/ML IJ SOLN: 0.0000 [IU] | Freq: Four times a day (QID) | INTRAMUSCULAR | Status: DC

## 2024-02-20 MED ORDER — ENSURE SURGERY PO LIQD
237.0000 mL | Freq: Two times a day (BID) | ORAL | Status: DC
  Administered 2024-02-21 – 2024-02-25 (×5): 237 mL via ORAL

## 2024-02-20 MED ADMIN — CEFEPIME 2 GM IVPB: 2 g | INTRAVENOUS | NDC 00409973501

## 2024-02-20 MED ADMIN — Buspirone HCl Tab 10 MG: 10 mg | ORAL | NDC 00093005405

## 2024-02-20 MED ADMIN — Methocarbamol Inj 1000 MG/10ML: 1000 mg | INTRAVENOUS | NDC 71288071611

## 2024-02-20 MED ADMIN — Enoxaparin Sodium Inj Soln Pref Syr 120 MG/0.8ML: 110 mg | SUBCUTANEOUS | NDC 00955101210

## 2024-02-20 MED ADMIN — Enoxaparin Sodium Inj Soln Pref Syr 120 MG/0.8ML: 110 mg | SUBCUTANEOUS | NDC 71839011510

## 2024-02-20 MED ADMIN — Furosemide Inj 10 MG/ML: 40 mg | INTRAVENOUS | NDC 71288020304

## 2024-02-20 NOTE — Care Management Important Message (Signed)
 Important Message  Patient Details  Name: Theresa Villarreal MRN: [id number] Date of Birth: 1950-05-31   Important Message Given:  Yes - Medicare IM     Jennie Laneta Dragon 02/20/2024, 12:14 PM

## 2024-02-20 NOTE — Plan of Care (Signed)
" °  Problem: Education: Goal: Knowledge of General Education information will improve Description: Including pain rating scale, medication(s)/side effects and non-pharmacologic comfort measures Outcome: Progressing   Problem: Health Behavior/Discharge Planning: Goal: Ability to manage health-related needs will improve Outcome: Progressing   Problem: Clinical Measurements: Goal: Ability to maintain clinical measurements within normal limits will improve Outcome: Progressing Goal: Will remain free from infection Outcome: Progressing Goal: Diagnostic test results will improve Outcome: Progressing Goal: Respiratory complications will improve Outcome: Progressing Goal: Cardiovascular complication will be avoided Outcome: Progressing   Problem: Activity: Goal: Risk for activity intolerance will decrease Outcome: Progressing   Problem: Nutrition: Goal: Adequate nutrition will be maintained Outcome: Progressing   Problem: Coping: Goal: Level of anxiety will decrease Outcome: Progressing   Problem: Elimination: Goal: Will not experience complications related to bowel motility Outcome: Progressing   Problem: Pain Managment: Goal: General experience of comfort will improve and/or be controlled Outcome: Progressing   Problem: Safety: Goal: Ability to remain free from injury will improve Outcome: Progressing   Problem: Skin Integrity: Goal: Risk for impaired skin integrity will decrease Outcome: Progressing   Problem: Fluid Volume: Goal: Hemodynamic stability will improve Outcome: Progressing   Problem: Clinical Measurements: Goal: Diagnostic test results will improve Outcome: Progressing Goal: Signs and symptoms of infection will decrease Outcome: Progressing   Problem: Respiratory: Goal: Ability to maintain adequate ventilation will improve Outcome: Progressing   Problem: Education: Goal: Ability to describe self-care measures that may prevent or decrease  complications (Diabetes Survival Skills Education) will improve Outcome: Progressing Goal: Individualized Educational Video(s) Outcome: Progressing   Problem: Coping: Goal: Ability to adjust to condition or change in health will improve Outcome: Progressing   Problem: Health Behavior/Discharge Planning: Goal: Ability to identify and utilize available resources and services will improve Outcome: Progressing Goal: Ability to manage health-related needs will improve Outcome: Progressing   Problem: Metabolic: Goal: Ability to maintain appropriate glucose levels will improve Outcome: Progressing   Problem: Nutritional: Goal: Maintenance of adequate nutrition will improve Outcome: Progressing Goal: Progress toward achieving an optimal weight will improve Outcome: Progressing   Problem: Skin Integrity: Goal: Risk for impaired skin integrity will decrease Outcome: Progressing   Problem: Tissue Perfusion: Goal: Adequacy of tissue perfusion will improve Outcome: Progressing   "
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
## 2024-02-20 NOTE — Progress Notes (Signed)
" °   02/20/24 0829  Vitals  Temp 97.7 F (36.5 C)  Temp Source Oral  BP (!) 101/47  MAP (mmHg) (!) 61  BP Location Left Arm  BP Method Automatic  Patient Position (if appropriate) Lying  Pulse Rate 83  Pulse Rate Source Monitor  ECG Heart Rate 84  Resp (!) 21  MEWS COLOR  MEWS Score Color Green  Oxygen Therapy  SpO2 97 %  O2 Device Nasal Cannula  O2 Flow Rate (L/min) 3 L/min  MEWS Score  MEWS Temp 0  MEWS Systolic 0  MEWS Pulse 0  MEWS RR 1  MEWS LOC 0  MEWS Score 1   Provider Maczis notified in person of patient complaint of chest tightness, no complaint of SOB does endorse history of anxiety stating this is a similar feeling.  "
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
## 2024-02-20 NOTE — Plan of Care (Incomplete)
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
## 2024-02-20 NOTE — Plan of Care (Signed)
" °  Problem: Education: Goal: Knowledge of General Education information will improve Description: Including pain rating scale, medication(s)/side effects and non-pharmacologic comfort measures 02/20/2024 2059 by Vivyan Biggers K, RN Outcome: Progressing 02/20/2024 2059 by Merilyn Merlynn POUR, RN Outcome: Progressing   Problem: Health Behavior/Discharge Planning: Goal: Ability to manage health-related needs will improve 02/20/2024 2059 by Konor Noren K, RN Outcome: Progressing 02/20/2024 2059 by Lonia Roane K, RN Outcome: Progressing   Problem: Clinical Measurements: Goal: Ability to maintain clinical measurements within normal limits will improve 02/20/2024 2059 by Dickson Kostelnik K, RN Outcome: Progressing 02/20/2024 2059 by Tzvi Economou K, RN Outcome: Progressing   "

## 2024-02-21 LAB — GLUCOSE, CAPILLARY
Glucose-Capillary: 116 mg/dL — ABNORMAL HIGH (ref 70–99)
Glucose-Capillary: 115 mg/dL — ABNORMAL HIGH (ref 70–99)
Glucose-Capillary: 118 mg/dL — ABNORMAL HIGH (ref 70–99)
Glucose-Capillary: 109 mg/dL — ABNORMAL HIGH (ref 70–99)
Glucose-Capillary: 92 mg/dL (ref 70–99)
Glucose-Capillary: 102 mg/dL — ABNORMAL HIGH (ref 70–99)

## 2024-02-21 LAB — BASIC METABOLIC PANEL WITH GFR
Anion gap: 11 (ref 5–15)
Anion gap: 8 (ref 5–15)
BUN: 25 mg/dL — ABNORMAL HIGH (ref 8–23)
BUN: 24 mg/dL — ABNORMAL HIGH (ref 8–23)
CO2: 26 mmol/L (ref 22–32)
CO2: 31 mmol/L (ref 22–32)
Calcium: 8.8 mg/dL — ABNORMAL LOW (ref 8.9–10.3)
Calcium: 8.5 mg/dL — ABNORMAL LOW (ref 8.9–10.3)
Chloride: 102 mmol/L (ref 98–111)
Chloride: 101 mmol/L (ref 98–111)
Creatinine, Ser: 0.82 mg/dL (ref 0.44–1.00)
Creatinine, Ser: 0.82 mg/dL (ref 0.44–1.00)
GFR, Estimated: >60 mL/min
GFR, Estimated: >60 mL/min
Glucose, Bld: 138 mg/dL — ABNORMAL HIGH (ref 70–99)
Glucose, Bld: 101 mg/dL — ABNORMAL HIGH (ref 70–99)
Potassium: 3.7 mmol/L (ref 3.5–5.1)
Potassium: 3.8 mmol/L (ref 3.5–5.1)
Sodium: 139 mmol/L (ref 135–145)
Sodium: 140 mmol/L (ref 135–145)

## 2024-02-21 LAB — CBC
HCT: 30.2 % — ABNORMAL LOW (ref 36.0–46.0)
Hemoglobin: 9.1 g/dL — ABNORMAL LOW (ref 12.0–15.0)
MCH: 28.5 pg (ref 26.0–34.0)
MCHC: 30.1 g/dL (ref 30.0–36.0)
MCV: 94.7 fL (ref 80.0–100.0)
Platelets: 564 K/uL — ABNORMAL HIGH (ref 150–400)
RBC: 3.19 MIL/uL — ABNORMAL LOW (ref 3.87–5.11)
RDW: 21.2 % — ABNORMAL HIGH (ref 11.5–15.5)
WBC: 18 K/uL — ABNORMAL HIGH (ref 4.0–10.5)
nRBC: 2.2 % — ABNORMAL HIGH (ref 0.0–0.2)

## 2024-02-21 LAB — TROPONIN T, HIGH SENSITIVITY: Troponin T High Sensitivity: 55 ng/L — ABNORMAL HIGH (ref 0–19)

## 2024-02-21 MED ADMIN — Buspirone HCl Tab 10 MG: 10 mg | ORAL | NDC 00093005405

## 2024-02-21 MED ADMIN — Buspirone HCl Tab 10 MG: 10 mg | ORAL | NDC 64380074206

## 2024-02-21 MED ADMIN — Methocarbamol Inj 1000 MG/10ML: 1000 mg | INTRAVENOUS | NDC 55150022310

## 2024-02-21 MED ADMIN — Methocarbamol Inj 1000 MG/10ML: 1000 mg | INTRAVENOUS | NDC 71288071611

## 2024-02-21 MED ADMIN — Haemophilus B Polysaccharide Conjugate Vac For Inj 10 MCG: 0.5 mL | INTRAMUSCULAR | NDC 58160072615

## 2024-02-21 MED ADMIN — Enoxaparin Sodium Inj Soln Pref Syr 120 MG/0.8ML: 110 mg | SUBCUTANEOUS | NDC 71839011510

## 2024-02-21 MED ADMIN — Furosemide Inj 10 MG/ML: 40 mg | INTRAVENOUS | NDC 71288020304

## 2024-02-21 MED ADMIN — Meningococcal Vac B (Recomb OMV Adjuv) Inj Prefilled Syringe: 0.5 mL | INTRAMUSCULAR | NDC 58160097602
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
## 2024-02-21 NOTE — Plan of Care (Signed)
" °  Problem: Education: Goal: Knowledge of General Education information will improve Description: Including pain rating scale, medication(s)/side effects and non-pharmacologic comfort measures Outcome: Progressing   Problem: Health Behavior/Discharge Planning: Goal: Ability to manage health-related needs will improve Outcome: Progressing   Problem: Clinical Measurements: Goal: Ability to maintain clinical measurements within normal limits will improve Outcome: Progressing Goal: Will remain free from infection Outcome: Progressing Goal: Diagnostic test results will improve Outcome: Progressing Goal: Respiratory complications will improve Outcome: Progressing Goal: Cardiovascular complication will be avoided Outcome: Progressing   Problem: Activity: Goal: Risk for activity intolerance will decrease Outcome: Progressing   Problem: Nutrition: Goal: Adequate nutrition will be maintained Outcome: Progressing   Problem: Coping: Goal: Level of anxiety will decrease Outcome: Progressing   Problem: Elimination: Goal: Will not experience complications related to bowel motility Outcome: Progressing   Problem: Pain Managment: Goal: General experience of comfort will improve and/or be controlled Outcome: Progressing   Problem: Safety: Goal: Ability to remain free from injury will improve Outcome: Progressing   Problem: Skin Integrity: Goal: Risk for impaired skin integrity will decrease Outcome: Progressing   Problem: Fluid Volume: Goal: Hemodynamic stability will improve Outcome: Progressing   Problem: Clinical Measurements: Goal: Diagnostic test results will improve Outcome: Progressing Goal: Signs and symptoms of infection will decrease Outcome: Progressing   Problem: Respiratory: Goal: Ability to maintain adequate ventilation will improve Outcome: Progressing   Problem: Education: Goal: Ability to describe self-care measures that may prevent or decrease  complications (Diabetes Survival Skills Education) will improve Outcome: Progressing Goal: Individualized Educational Video(s) Outcome: Progressing   Problem: Coping: Goal: Ability to adjust to condition or change in health will improve Outcome: Progressing   Problem: Health Behavior/Discharge Planning: Goal: Ability to identify and utilize available resources and services will improve Outcome: Progressing Goal: Ability to manage health-related needs will improve Outcome: Progressing   Problem: Metabolic: Goal: Ability to maintain appropriate glucose levels will improve Outcome: Progressing   Problem: Nutritional: Goal: Maintenance of adequate nutrition will improve Outcome: Progressing Goal: Progress toward achieving an optimal weight will improve Outcome: Progressing   Problem: Skin Integrity: Goal: Risk for impaired skin integrity will decrease Outcome: Progressing   Problem: Tissue Perfusion: Goal: Adequacy of tissue perfusion will improve Outcome: Progressing   "
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
## 2024-02-22 LAB — BASIC METABOLIC PANEL WITH GFR
Anion gap: 7 (ref 5–15)
BUN: 21 mg/dL (ref 8–23)
CO2: 30 mmol/L (ref 22–32)
Calcium: 8.2 mg/dL — ABNORMAL LOW (ref 8.9–10.3)
Chloride: 102 mmol/L (ref 98–111)
Creatinine, Ser: 0.72 mg/dL (ref 0.44–1.00)
GFR, Estimated: >60 mL/min
Glucose, Bld: 99 mg/dL (ref 70–99)
Potassium: 3.9 mmol/L (ref 3.5–5.1)
Sodium: 139 mmol/L (ref 135–145)

## 2024-02-22 LAB — CBC
HCT: 27.1 % — ABNORMAL LOW (ref 36.0–46.0)
Hemoglobin: 8.3 g/dL — ABNORMAL LOW (ref 12.0–15.0)
MCH: 28.7 pg (ref 26.0–34.0)
MCHC: 30.6 g/dL (ref 30.0–36.0)
MCV: 93.8 fL (ref 80.0–100.0)
Platelets: 608 K/uL — ABNORMAL HIGH (ref 150–400)
RBC: 2.89 MIL/uL — ABNORMAL LOW (ref 3.87–5.11)
RDW: 21.6 % — ABNORMAL HIGH (ref 11.5–15.5)
WBC: 13.8 K/uL — ABNORMAL HIGH (ref 4.0–10.5)
nRBC: 1.2 % — ABNORMAL HIGH (ref 0.0–0.2)

## 2024-02-22 LAB — GLUCOSE, CAPILLARY
Glucose-Capillary: 91 mg/dL (ref 70–99)
Glucose-Capillary: 82 mg/dL (ref 70–99)
Glucose-Capillary: 96 mg/dL (ref 70–99)
Glucose-Capillary: 115 mg/dL — ABNORMAL HIGH (ref 70–99)

## 2024-02-22 MED ORDER — POLYETHYLENE GLYCOL 3350 17 G PO PACK
17.0000 g | PACK | Freq: Every day | ORAL | Status: DC
  Administered 2024-02-22 – 2024-02-26 (×5): 17 g via ORAL

## 2024-02-22 MED ADMIN — Buspirone HCl Tab 10 MG: 10 mg | ORAL | NDC 64380074206

## 2024-02-22 MED ADMIN — Methocarbamol Inj 1000 MG/10ML: 1000 mg | INTRAVENOUS | NDC 55150022310

## 2024-02-22 MED ADMIN — Enoxaparin Sodium Inj Soln Pref Syr 120 MG/0.8ML: 110 mg | SUBCUTANEOUS | NDC 71839011510

## 2024-02-22 MED ADMIN — Furosemide Inj 10 MG/ML: 40 mg | INTRAVENOUS | NDC 71288020304

## 2024-02-22 MED ADMIN — Meningococcal (A, C, Y, and W-135) Oligo Conj Vac IM Soln: 0.5 mL | INTRAMUSCULAR | NDC 58160082703
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
## 2024-02-22 NOTE — Plan of Care (Signed)
" °  Problem: Education: Goal: Knowledge of General Education information will improve Description: Including pain rating scale, medication(s)/side effects and non-pharmacologic comfort measures Outcome: Progressing   Problem: Health Behavior/Discharge Planning: Goal: Ability to manage health-related needs will improve Outcome: Progressing   Problem: Clinical Measurements: Goal: Ability to maintain clinical measurements within normal limits will improve Outcome: Progressing Goal: Will remain free from infection Outcome: Progressing Goal: Diagnostic test results will improve Outcome: Progressing Goal: Respiratory complications will improve Outcome: Progressing Goal: Cardiovascular complication will be avoided Outcome: Progressing   Problem: Activity: Goal: Risk for activity intolerance will decrease Outcome: Progressing   Problem: Nutrition: Goal: Adequate nutrition will be maintained Outcome: Progressing   Problem: Coping: Goal: Level of anxiety will decrease Outcome: Progressing   Problem: Elimination: Goal: Will not experience complications related to bowel motility Outcome: Progressing   Problem: Pain Managment: Goal: General experience of comfort will improve and/or be controlled Outcome: Progressing   Problem: Safety: Goal: Ability to remain free from injury will improve Outcome: Progressing   Problem: Skin Integrity: Goal: Risk for impaired skin integrity will decrease Outcome: Progressing   Problem: Fluid Volume: Goal: Hemodynamic stability will improve Outcome: Progressing   Problem: Clinical Measurements: Goal: Diagnostic test results will improve Outcome: Progressing Goal: Signs and symptoms of infection will decrease Outcome: Progressing   Problem: Respiratory: Goal: Ability to maintain adequate ventilation will improve Outcome: Progressing   Problem: Education: Goal: Ability to describe self-care measures that may prevent or decrease  complications (Diabetes Survival Skills Education) will improve Outcome: Progressing Goal: Individualized Educational Video(s) Outcome: Progressing   Problem: Coping: Goal: Ability to adjust to condition or change in health will improve Outcome: Progressing   Problem: Health Behavior/Discharge Planning: Goal: Ability to identify and utilize available resources and services will improve Outcome: Progressing Goal: Ability to manage health-related needs will improve Outcome: Progressing   Problem: Metabolic: Goal: Ability to maintain appropriate glucose levels will improve Outcome: Progressing   Problem: Nutritional: Goal: Maintenance of adequate nutrition will improve Outcome: Progressing Goal: Progress toward achieving an optimal weight will improve Outcome: Progressing   Problem: Skin Integrity: Goal: Risk for impaired skin integrity will decrease Outcome: Progressing   Problem: Tissue Perfusion: Goal: Adequacy of tissue perfusion will improve Outcome: Progressing   "

## 2024-02-22 NOTE — TOC Progression Note (Signed)
 Transition of Care [facility]) - Progression Note    Patient Details  Name: Theresa Villarreal MRN: [id number] Date of Birth: [birth year]  Transition of Care [facility]) CM/SW Contact  Inocente GORMAN Kindle, LCSW Phone Number: 02/22/2024, 10:31 AM  Clinical Narrative:    CSW continuing to follow for SNF (facilities closed today due to the holiday).      Barriers to Discharge: Continued Medical Work up               Expected Discharge Plan and Services       Living arrangements for the past 2 months: Single Family Home                                       Social Drivers of Health (SDOH) Interventions SDOH Screenings   Tobacco Use: Low Risk (02/07/2024)    Readmission Risk Interventions     No data to display

## 2024-02-22 NOTE — Plan of Care (Signed)

## 2024-02-23 ENCOUNTER — Other Ambulatory Visit (HOSPITAL_COMMUNITY): Payer: Self-pay

## 2024-02-23 ENCOUNTER — Telehealth (HOSPITAL_COMMUNITY): Payer: Self-pay

## 2024-02-23 ENCOUNTER — Inpatient Hospital Stay (HOSPITAL_COMMUNITY)

## 2024-02-23 LAB — CBC
HCT: 28.3 % — ABNORMAL LOW (ref 36.0–46.0)
Hemoglobin: 8.7 g/dL — ABNORMAL LOW (ref 12.0–15.0)
MCH: 28.9 pg (ref 26.0–34.0)
MCHC: 30.7 g/dL (ref 30.0–36.0)
MCV: 94 fL (ref 80.0–100.0)
Platelets: 665 K/uL — ABNORMAL HIGH (ref 150–400)
RBC: 3.01 MIL/uL — ABNORMAL LOW (ref 3.87–5.11)
RDW: 20.9 % — ABNORMAL HIGH (ref 11.5–15.5)
WBC: 14 K/uL — ABNORMAL HIGH (ref 4.0–10.5)
nRBC: 0.8 % — ABNORMAL HIGH (ref 0.0–0.2)

## 2024-02-23 LAB — BASIC METABOLIC PANEL WITH GFR
Anion gap: 7 (ref 5–15)
BUN: 18 mg/dL (ref 8–23)
CO2: 31 mmol/L (ref 22–32)
Calcium: 8 mg/dL — ABNORMAL LOW (ref 8.9–10.3)
Chloride: 99 mmol/L (ref 98–111)
Creatinine, Ser: 0.81 mg/dL (ref 0.44–1.00)
GFR, Estimated: >60 mL/min
Glucose, Bld: 107 mg/dL — ABNORMAL HIGH (ref 70–99)
Potassium: 3.6 mmol/L (ref 3.5–5.1)
Sodium: 137 mmol/L (ref 135–145)

## 2024-02-23 LAB — GLUCOSE, CAPILLARY
Glucose-Capillary: 111 mg/dL — ABNORMAL HIGH (ref 70–99)
Glucose-Capillary: 118 mg/dL — ABNORMAL HIGH (ref 70–99)

## 2024-02-23 MED ORDER — DOCUSATE SODIUM 100 MG PO CAPS
100.0000 mg | ORAL_CAPSULE | Freq: Two times a day (BID) | ORAL | Status: DC
  Administered 2024-02-23 – 2024-02-26 (×7): 100 mg via ORAL

## 2024-02-23 MED ADMIN — Buspirone HCl Tab 10 MG: 10 mg | ORAL | NDC 64380074206

## 2024-02-23 MED ADMIN — Methocarbamol Inj 1000 MG/10ML: 1000 mg | INTRAVENOUS | NDC 55150022310

## 2024-02-23 MED ADMIN — Enoxaparin Sodium Inj Soln Pref Syr 120 MG/0.8ML: 110 mg | SUBCUTANEOUS | NDC 71839011510

## 2024-02-23 MED ADMIN — Furosemide Inj 10 MG/ML: 60 mg | INTRAVENOUS | NDC 71288020302

## 2024-02-23 NOTE — Progress Notes (Signed)
 RUE PICC removed. Site unremarkable. Pressure dressing applied. Pt instructed to remain as flat as possible in bed for 30 minutes. Dressing to remain in place for 24 hours.

## 2024-02-23 NOTE — Plan of Care (Signed)
" °  Problem: Education: Goal: Knowledge of General Education information will improve Description: Including pain rating scale, medication(s)/side effects and non-pharmacologic comfort measures Outcome: Progressing   Problem: Health Behavior/Discharge Planning: Goal: Ability to manage health-related needs will improve Outcome: Progressing   Problem: Clinical Measurements: Goal: Ability to maintain clinical measurements within normal limits will improve Outcome: Progressing Goal: Will remain free from infection Outcome: Progressing Goal: Diagnostic test results will improve Outcome: Progressing Goal: Respiratory complications will improve Outcome: Progressing Goal: Cardiovascular complication will be avoided Outcome: Progressing   Problem: Activity: Goal: Risk for activity intolerance will decrease Outcome: Progressing   Problem: Nutrition: Goal: Adequate nutrition will be maintained Outcome: Progressing   Problem: Coping: Goal: Level of anxiety will decrease Outcome: Progressing   Problem: Elimination: Goal: Will not experience complications related to bowel motility Outcome: Progressing   Problem: Pain Managment: Goal: General experience of comfort will improve and/or be controlled Outcome: Progressing   Problem: Safety: Goal: Ability to remain free from injury will improve Outcome: Progressing   Problem: Skin Integrity: Goal: Risk for impaired skin integrity will decrease Outcome: Progressing   Problem: Fluid Volume: Goal: Hemodynamic stability will improve Outcome: Progressing   Problem: Clinical Measurements: Goal: Diagnostic test results will improve Outcome: Progressing Goal: Signs and symptoms of infection will decrease Outcome: Progressing   Problem: Respiratory: Goal: Ability to maintain adequate ventilation will improve Outcome: Progressing   Problem: Education: Goal: Ability to describe self-care measures that may prevent or decrease  complications (Diabetes Survival Skills Education) will improve Outcome: Progressing Goal: Individualized Educational Video(s) Outcome: Progressing   Problem: Coping: Goal: Ability to adjust to condition or change in health will improve Outcome: Progressing   Problem: Health Behavior/Discharge Planning: Goal: Ability to identify and utilize available resources and services will improve Outcome: Progressing Goal: Ability to manage health-related needs will improve Outcome: Progressing   Problem: Metabolic: Goal: Ability to maintain appropriate glucose levels will improve Outcome: Progressing   Problem: Nutritional: Goal: Maintenance of adequate nutrition will improve Outcome: Progressing Goal: Progress toward achieving an optimal weight will improve Outcome: Progressing   Problem: Skin Integrity: Goal: Risk for impaired skin integrity will decrease Outcome: Progressing   Problem: Tissue Perfusion: Goal: Adequacy of tissue perfusion will improve Outcome: Progressing   "

## 2024-02-23 NOTE — Progress Notes (Signed)
" ° °  Inpatient Rehabilitation Admissions Coordinator   Therapy recommending SNF and I concur that unable to tolerate CIR level rehab at this time. TOC Is aware.  Heron Leavell, RN, MSN Rehab Admissions Coordinator [phone] 02/23/2024 12:10 PM  "

## 2024-02-23 NOTE — Telephone Encounter (Signed)
 Pharmacy Patient Advocate Encounter  Insurance verification completed.    The patient is insured through [facility]. Patient has Medicare and is not eligible for a copay card, but may be able to apply for patient assistance or Medicare RX Payment Plan (Patient Must reach out to their plan, if eligible for payment plan), if available.    Ran test claim for Eliquis 5mg  tablet and the current 30 day co-pay is $292.84 due to deductible- will be $47 once its met.  Ran test claim for Xarelto 20mg  tablet and the current 30 day co-pay is $292.84 due to deductible- will be $47 once its met.  This test claim was processed through [facility]- copay amounts may vary at other pharmacies due to pharmacy/plan contracts, or as the patient moves through the different stages of their insurance plan.

## 2024-02-23 NOTE — TOC Progression Note (Addendum)
 Transition of Care [facility]) - Progression Note    Patient Details  Name: AROUSH CHASSE MRN: [id number] Date of Birth: 03-Sep-1950  [facility]) CM/SW Contact  Zackry Deines E Valissa Lyvers, LCSW Phone Number: 02/23/2024, 11:55 AM  Clinical Narrative:    Spoke with patient's daughter Laneta, sent secure email with list of SNF bed offers. Laneta states they would like to know if CIR can re-evaluate patient for eligibility. Explained patient could not tolerate CIR intensity as of note on 12/11, asked CIR if they can re-screen patient. Laneta agreeable to reviewing SNF options as back up plan if CIR still cannot take the patient.  2:30- Notified patient's daughter that patient is still not eligible for AIR due to not being able to tolerate the intensity of AIR. Laneta states the family will review bed offers with patient and have a choice by tomorrow so that insurance auth can be started.      Barriers to Discharge: Continued Medical Work up               Expected Discharge Plan and Services       Living arrangements for the past 2 months: Single Family Home                                       Social Drivers of Health (SDOH) Interventions SDOH Screenings   Tobacco Use: Low Risk (02/07/2024)    Readmission Risk Interventions     No data to display

## 2024-02-24 MED ADMIN — Buspirone HCl Tab 10 MG: 10 mg | ORAL | NDC 64380074206

## 2024-02-24 MED ADMIN — Methocarbamol Inj 1000 MG/10ML: 1000 mg | INTRAVENOUS | NDC 55150022310

## 2024-02-24 MED ADMIN — Enoxaparin Sodium Inj Soln Pref Syr 120 MG/0.8ML: 110 mg | SUBCUTANEOUS | NDC 71839011510

## 2024-02-25 DIAGNOSIS — F430 Acute stress reaction: Secondary | ICD-10-CM

## 2024-02-25 MED ORDER — HYDROXYZINE HCL 25 MG PO TABS
25.0000 mg | ORAL_TABLET | Freq: Three times a day (TID) | ORAL | Status: DC | PRN
  Administered 2024-02-25 – 2024-02-26 (×2): 25 mg via ORAL

## 2024-02-25 MED ORDER — GABAPENTIN 100 MG PO CAPS
100.0000 mg | ORAL_CAPSULE | Freq: Three times a day (TID) | ORAL | Status: DC
  Administered 2024-02-25 – 2024-02-26 (×3): 100 mg via ORAL

## 2024-02-25 MED ORDER — ACETAMINOPHEN 500 MG PO TABS
1000.0000 mg | ORAL_TABLET | Freq: Four times a day (QID) | ORAL | Status: DC
  Administered 2024-02-25 – 2024-02-26 (×5): 1000 mg via ORAL

## 2024-02-25 MED ORDER — BUSPIRONE HCL 5 MG PO TABS
15.0000 mg | ORAL_TABLET | Freq: Three times a day (TID) | ORAL | Status: DC
  Administered 2024-02-25 – 2024-02-26 (×3): 15 mg via ORAL

## 2024-02-25 MED ORDER — METHOCARBAMOL 500 MG PO TABS
1000.0000 mg | ORAL_TABLET | Freq: Four times a day (QID) | ORAL | Status: DC
  Administered 2024-02-25 – 2024-02-26 (×5): 1000 mg via ORAL

## 2024-02-25 MED ORDER — APIXABAN 5 MG PO TABS
5.0000 mg | ORAL_TABLET | Freq: Two times a day (BID) | ORAL | Status: DC
  Administered 2024-02-25 – 2024-02-26 (×2): 5 mg via ORAL

## 2024-02-25 MED ADMIN — Buspirone HCl Tab 10 MG: 10 mg | ORAL | NDC 64380074206

## 2024-02-25 MED ADMIN — Methocarbamol Inj 1000 MG/10ML: 1000 mg | INTRAVENOUS | NDC 55150022310

## 2024-02-25 MED ADMIN — Enoxaparin Sodium Inj Soln Pref Syr 120 MG/0.8ML: 110 mg | SUBCUTANEOUS | NDC 71839011510

## 2024-02-26 MED ORDER — LIDOCAINE 5 % EX PTCH: 2.0000 | MEDICATED_PATCH | CUTANEOUS | Status: AC

## 2024-02-26 MED ORDER — ADULT MULTIVITAMIN W/MINERALS CH: 1.0000 | ORAL_TABLET | Freq: Every day | ORAL | Status: AC

## 2024-02-26 MED ORDER — BUSPIRONE HCL 15 MG PO TABS: 15.0000 mg | ORAL_TABLET | Freq: Three times a day (TID) | ORAL | Status: AC

## 2024-02-26 MED ORDER — ENSURE PLUS HIGH PROTEIN PO LIQD
237.0000 mL | Freq: Two times a day (BID) | ORAL | Status: DC
  Administered 2024-02-26: 237 mL via ORAL

## 2024-02-26 MED ORDER — TRAMADOL HCL 50 MG PO TABS: 100.0000 mg | ORAL_TABLET | Freq: Four times a day (QID) | ORAL | 0 refills | Status: AC

## 2024-02-26 MED ORDER — GABAPENTIN 100 MG PO CAPS: 100.0000 mg | ORAL_CAPSULE | Freq: Three times a day (TID) | ORAL | Status: AC

## 2024-02-26 MED ORDER — METHOCARBAMOL 1000 MG PO TABS: 1000.0000 mg | ORAL_TABLET | Freq: Four times a day (QID) | ORAL | Status: AC | PRN

## 2024-02-26 MED ORDER — APIXABAN 5 MG PO TABS: 5.0000 mg | ORAL_TABLET | Freq: Two times a day (BID) | ORAL | Status: DC

## 2024-02-26 MED ORDER — ACETAMINOPHEN 500 MG PO TABS: 1000.0000 mg | ORAL_TABLET | Freq: Four times a day (QID) | ORAL | Status: AC

## 2024-02-26 MED ORDER — OXYCODONE HCL 5 MG PO TABS: 5.0000 mg | ORAL_TABLET | Freq: Four times a day (QID) | ORAL | 0 refills | Status: AC | PRN

## 2024-02-26 MED ORDER — GUAIFENESIN 200 MG PO TABS: 600.0000 mg | ORAL_TABLET | Freq: Three times a day (TID) | ORAL | Status: AC | PRN

## 2024-02-26 MED ORDER — DOCUSATE SODIUM 100 MG PO CAPS: 100.0000 mg | ORAL_CAPSULE | Freq: Two times a day (BID) | ORAL | Status: AC

## 2024-02-26 MED ORDER — HYDROXYZINE HCL 25 MG PO TABS: 25.0000 mg | ORAL_TABLET | Freq: Three times a day (TID) | ORAL | Status: AC | PRN

## 2024-02-26 MED ORDER — POLYETHYLENE GLYCOL 3350 17 G PO PACK: 17.0000 g | PACK | Freq: Every day | ORAL | Status: AC | PRN

## 2024-02-27 ENCOUNTER — Encounter: Payer: Self-pay | Admitting: Gastroenterology
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
## 2024-03-12 ENCOUNTER — Ambulatory Visit: Attending: Vascular Surgery | Admitting: Student-PharmD

## 2024-03-12 ENCOUNTER — Encounter: Payer: Self-pay | Admitting: Student-PharmD

## 2024-03-12 DIAGNOSIS — I82451 Acute embolism and thrombosis of right peroneal vein: Secondary | ICD-10-CM

## 2024-03-12 MED ORDER — APIXABAN 5 MG PO TABS: 5.0000 mg | ORAL_TABLET | Freq: Two times a day (BID) | ORAL | 1 refills | Status: AC

## 2024-03-12 NOTE — Patient Instructions (Signed)
-  Continue apixaban  (Eliquis ) 5 mg twice daily. We will plan to treat your blood clot with Eliquis  for a total of 3-6 months. I'll see you in March (end of 3 months) to assess if we can come off treatment. -Your refills have been sent to your [facility]. You may need to call the pharmacy to ask them to fill this when you start to run low on your current supply.  -It is important to take your medication around the same time every day.  -Avoid NSAIDs like ibuprofen  (Advil , Motrin ) and naproxen (Aleve) as well as aspirin doses over 100 mg daily. -Tylenol  (acetaminophen ) is the preferred over the counter pain medication to lower the risk of bleeding. -Be sure to alert all of your health care providers that you are taking an anticoagulant prior to starting a new medication or having a procedure. -Monitor for signs and symptoms of bleeding (abnormal bruising, prolonged bleeding, nose bleeds, bleeding from gums, discolored urine, black tarry stools). If you have fallen and hit your head OR if your bleeding is severe or not stopping, seek emergency care.  -Go to the emergency room if emergent signs and symptoms of new clot occur (new or worse swelling and pain in an arm or leg, shortness of breath, chest pain, fast or irregular heartbeats, lightheadedness, dizziness, fainting, coughing up blood) or if you experience a significant color change (pale or blue) in the extremity that has the DVT.  -We recommend you wear compression stockings (20-30 mmHg) as long as you are having swelling or pain. Be sure to purchase the correct size and take them off at night.   If you have any questions or need to reschedule an appointment, please call [phone]. If you are having an emergency, call 911 or present to the nearest emergency room.   What is a DVT?  -Deep vein thrombosis (DVT) is a condition in which a blood clot forms in a vein of the deep venous system which can occur in the lower leg, thigh, pelvis, arm, or neck.  This condition is serious and can be life-threatening if the clot travels to the arteries of the lungs and causing a blockage (pulmonary embolism, PE). A DVT can also damage veins in the leg, which can lead to long-term venous disease, leg pain, swelling, discoloration, and ulcers or sores (post-thrombotic syndrome).  -Treatment may include taking an anticoagulant medication to prevent more clots from forming and the current clot from growing, wearing compression stockings, and/or surgical procedures to remove or dissolve the clot.

## 2024-03-31 ENCOUNTER — Other Ambulatory Visit: Payer: Self-pay

## 2024-03-31 ENCOUNTER — Emergency Department (HOSPITAL_COMMUNITY)

## 2024-03-31 ENCOUNTER — Emergency Department (HOSPITAL_COMMUNITY)
Admission: EM | Admit: 2024-03-31 | Discharge: 2024-03-31 | Disposition: A | Discharge status: Home / Self Care | Attending: Emergency Medicine | Admitting: Emergency Medicine

## 2024-03-31 ENCOUNTER — Encounter (HOSPITAL_COMMUNITY): Payer: Self-pay | Admitting: Emergency Medicine

## 2024-03-31 DIAGNOSIS — Z7901 Long term (current) use of anticoagulants: Secondary | ICD-10-CM | POA: Insufficient documentation

## 2024-03-31 DIAGNOSIS — Z79899 Other long term (current) drug therapy: Secondary | ICD-10-CM | POA: Insufficient documentation

## 2024-03-31 DIAGNOSIS — R0789 Other chest pain: Secondary | ICD-10-CM | POA: Insufficient documentation

## 2024-03-31 DIAGNOSIS — J90 Pleural effusion, not elsewhere classified: Secondary | ICD-10-CM

## 2024-03-31 LAB — BASIC METABOLIC PANEL WITH GFR
Anion gap: 12 (ref 5–15)
BUN: 6 mg/dL — ABNORMAL LOW (ref 8–23)
CO2: 24 mmol/L (ref 22–32)
Calcium: 9.2 mg/dL (ref 8.9–10.3)
Chloride: 105 mmol/L (ref 98–111)
Creatinine, Ser: 0.87 mg/dL (ref 0.44–1.00)
GFR, Estimated: >60 mL/min
Glucose, Bld: 115 mg/dL — ABNORMAL HIGH (ref 70–99)
Potassium: 3.7 mmol/L (ref 3.5–5.1)
Sodium: 141 mmol/L (ref 135–145)

## 2024-03-31 LAB — TROPONIN T, HIGH SENSITIVITY
Troponin T High Sensitivity: 21 ng/L — ABNORMAL HIGH (ref 0–19)
Troponin T High Sensitivity: 22 ng/L — ABNORMAL HIGH (ref 0–19)

## 2024-03-31 LAB — CBC
HCT: 33.5 % — ABNORMAL LOW (ref 36.0–46.0)
Hemoglobin: 10.7 g/dL — ABNORMAL LOW (ref 12.0–15.0)
MCH: 28.1 pg (ref 26.0–34.0)
MCHC: 31.9 g/dL (ref 30.0–36.0)
MCV: 87.9 fL (ref 80.0–100.0)
Platelets: 372 10*3/uL (ref 150–400)
RBC: 3.81 MIL/uL — ABNORMAL LOW (ref 3.87–5.11)
RDW: 17.4 % — ABNORMAL HIGH (ref 11.5–15.5)
WBC: 10.5 10*3/uL (ref 4.0–10.5)
nRBC: 0 % (ref 0.0–0.2)

## 2024-03-31 MED ORDER — FUROSEMIDE 20 MG PO TABS: 20.0000 mg | ORAL_TABLET | Freq: Every day | ORAL | 0 refills | Status: AC

## 2024-03-31 MED ORDER — ACETAMINOPHEN 500 MG PO TABS
1000.0000 mg | ORAL_TABLET | Freq: Once | ORAL | Status: AC
  Administered 2024-03-31: 1000 mg via ORAL
  Filled 2024-03-31: qty 2

## 2024-03-31 MED ORDER — IOHEXOL 350 MG/ML SOLN
75.0000 mL | Freq: Once | INTRAVENOUS | Status: AC | PRN
  Administered 2024-03-31: 75 mL via INTRAVENOUS

## 2024-03-31 NOTE — Discharge Instructions (Signed)
 Please follow-up with your primary care doctor in the clinic this week for your ongoing symptoms.

## 2024-03-31 NOTE — ED Notes (Signed)
 CCMD contacted to put patient on cardiac monitoring.

## 2024-05-09 ENCOUNTER — Ambulatory Visit: Admitting: Student-PharmD
